# Patient Record
Sex: Male | Born: 1950 | Hispanic: No | Smoking: Former smoker
Health system: Southern US, Community
[De-identification: ages and names within clinical notes are randomized; demographics above are authoritative.]

## PROBLEM LIST (undated history)

## (undated) DIAGNOSIS — K219 Gastro-esophageal reflux disease without esophagitis: Secondary | ICD-10-CM

## (undated) DIAGNOSIS — D649 Anemia, unspecified: Secondary | ICD-10-CM

## (undated) DIAGNOSIS — I10 Essential (primary) hypertension: Secondary | ICD-10-CM

## (undated) DIAGNOSIS — I2699 Other pulmonary embolism without acute cor pulmonale: Secondary | ICD-10-CM

## (undated) HISTORY — PX: KNEE ARTHROSCOPY: SUR90

## (undated) HISTORY — DX: Anemia, unspecified: D64.9

## (undated) HISTORY — DX: Gastro-esophageal reflux disease without esophagitis: K21.9

## (undated) HISTORY — PX: CHOLECYSTECTOMY: SHX55

## (undated) HISTORY — DX: Essential (primary) hypertension: I10

## (undated) HISTORY — DX: Other pulmonary embolism without acute cor pulmonale: I26.99

---

## 2009-11-07 DIAGNOSIS — M545 Low back pain, unspecified: Secondary | ICD-10-CM | POA: Insufficient documentation

## 2009-11-07 DIAGNOSIS — R51 Headache: Secondary | ICD-10-CM | POA: Insufficient documentation

## 2009-11-07 DIAGNOSIS — R519 Headache, unspecified: Secondary | ICD-10-CM | POA: Insufficient documentation

## 2009-11-12 DIAGNOSIS — D649 Anemia, unspecified: Secondary | ICD-10-CM | POA: Insufficient documentation

## 2012-03-04 DIAGNOSIS — R011 Cardiac murmur, unspecified: Secondary | ICD-10-CM | POA: Insufficient documentation

## 2013-04-15 ENCOUNTER — Ambulatory Visit
Admission: RE | Admit: 2013-04-15 | Disposition: A | Discharge status: Home / Self Care | Payer: Self-pay | Source: Ambulatory Visit

## 2013-04-15 LAB — CBC AND DIFFERENTIAL
Basophils %: 0.3 % (ref 0.0–3.0)
Basophils Absolute: 0 10*3/uL (ref 0.0–0.3)
Eosinophils %: 4.9 % (ref 0.0–7.0)
Eosinophils Absolute: 0.3 10*3/uL (ref 0.0–0.8)
Hematocrit: 42.5 % (ref 39.0–52.5)
Hemoglobin: 14.2 gm/dL (ref 13.0–17.5)
Lymphocytes Absolute: 1.1 10*3/uL (ref 0.6–5.1)
Lymphocytes: 20.4 % (ref 15.0–46.0)
MCH: 29 pg (ref 28–35)
MCHC: 33 gm/dL (ref 32–36)
MCV: 88 fL (ref 80–100)
MPV: 10 fL (ref 6.0–10.0)
Monocytes Absolute: 0.6 10*3/uL (ref 0.1–1.7)
Monocytes: 11.2 % (ref 3.0–15.0)
Neutrophils %: 63.2 % (ref 42.0–78.0)
Neutrophils Absolute: 3.3 10*3/uL (ref 1.7–8.6)
PLT CT: 158 10*3/uL (ref 130–440)
RBC: 4.85 10*6/uL (ref 4.00–5.70)
RDW: 12.3 % (ref 11.0–14.0)
WBC: 5.2 10*3/uL (ref 4.0–11.0)

## 2013-04-15 LAB — BASIC METABOLIC PANEL
Anion Gap: 14.8 mMol/L (ref 7.0–18.0)
BUN / Creatinine Ratio: 16.5 Ratio (ref 10.0–30.0)
BUN: 19 mg/dL (ref 7–22)
CO2: 20.7 mMol/L (ref 20.0–30.0)
Calcium: 9.3 mg/dL (ref 8.5–10.5)
Chloride: 107 mMol/L (ref 98–110)
Creatinine: 1.15 mg/dL (ref 0.80–1.30)
EGFR: >60 mL/min/{1.73_m2}
Glucose: 88 mg/dL (ref 70–99)
Osmolality Calc: 277 mOsm/kg (ref 275–300)
Potassium: 4.5 mMol/L (ref 3.5–5.3)
Sodium: 138 mMol/L (ref 136–147)

## 2013-04-15 LAB — CK: Creatine Kinase (CK): 275 U/L — ABNORMAL HIGH (ref 30–230)

## 2013-07-01 ENCOUNTER — Encounter (RURAL_HEALTH_CENTER): Payer: Self-pay | Admitting: Family Medicine

## 2013-10-07 ENCOUNTER — Other Ambulatory Visit (RURAL_HEALTH_CENTER): Payer: Self-pay | Admitting: Family Medicine

## 2013-10-16 ENCOUNTER — Ambulatory Visit (RURAL_HEALTH_CENTER): Payer: Self-pay | Admitting: Family Medicine

## 2013-11-07 ENCOUNTER — Encounter (RURAL_HEALTH_CENTER): Payer: Self-pay | Admitting: Family Medicine

## 2013-11-10 ENCOUNTER — Encounter (RURAL_HEALTH_CENTER): Payer: Self-pay | Admitting: Family Medicine

## 2013-11-10 ENCOUNTER — Ambulatory Visit (RURAL_HEALTH_CENTER): Payer: PRIVATE HEALTH INSURANCE | Admitting: Family Medicine

## 2013-11-10 ENCOUNTER — Other Ambulatory Visit
Admission: RE | Admit: 2013-11-10 | Discharge: 2013-11-10 | Disposition: A | Discharge status: Home / Self Care | Payer: PRIVATE HEALTH INSURANCE | Source: Ambulatory Visit | Attending: Family Medicine | Admitting: Family Medicine

## 2013-11-10 VITALS — BP 122/78 | HR 76 | Temp 97.8°F | Resp 18 | Ht 69.69 in | Wt 210.0 lb

## 2013-11-10 LAB — COMPREHENSIVE METABOLIC PANEL
ALT: 17 U/L (ref 0–55)
AST (SGOT): 22 U/L (ref 10–42)
Albumin/Globulin Ratio: 1.24 Ratio (ref 0.70–1.50)
Albumin: 4.2 gm/dL (ref 3.5–5.0)
Alkaline Phosphatase: 57 U/L (ref 40–145)
Anion Gap: 9.6 mMol/L (ref 7.0–18.0)
BUN / Creatinine Ratio: 18 Ratio (ref 10.0–30.0)
BUN: 20 mg/dL (ref 7–22)
Bilirubin, Total: 0.7 mg/dL (ref 0.1–1.2)
CO2: 25.8 mMol/L (ref 20.0–30.0)
Calcium: 9.2 mg/dL (ref 8.5–10.5)
Chloride: 108 mMol/L (ref 98–110)
Creatinine: 1.11 mg/dL (ref 0.80–1.30)
EGFR: >60 mL/min/{1.73_m2}
Globulin: 3.4 gm/dL (ref 2.0–4.0)
Glucose: 91 mg/dL (ref 70–99)
Osmolality Calc: 280 mOsm/kg (ref 275–300)
Potassium: 4.4 mMol/L (ref 3.5–5.3)
Protein, Total: 7.6 gm/dL (ref 6.0–8.3)
Sodium: 139 mMol/L (ref 136–147)

## 2013-11-10 LAB — CBC AND DIFFERENTIAL
Basophils %: 0.6 % (ref 0.0–3.0)
Basophils Absolute: 0 10*3/uL (ref 0.0–0.3)
Eosinophils %: 5.8 % (ref 0.0–7.0)
Eosinophils Absolute: 0.3 10*3/uL (ref 0.0–0.8)
Hematocrit: 42.1 % (ref 39.0–52.5)
Hemoglobin: 14.5 gm/dL (ref 13.0–17.5)
Lymphocytes Absolute: 1 10*3/uL (ref 0.6–5.1)
Lymphocytes: 19.9 % (ref 15.0–46.0)
MCH: 30 pg (ref 28–35)
MCHC: 35 gm/dL (ref 32–36)
MCV: 87 fL (ref 80–100)
MPV: 10 fL (ref 6.0–10.0)
Monocytes Absolute: 0.5 10*3/uL (ref 0.1–1.7)
Monocytes: 10.1 % (ref 3.0–15.0)
Neutrophils %: 63.6 % (ref 42.0–78.0)
Neutrophils Absolute: 3.1 10*3/uL (ref 1.7–8.6)
PLT CT: 145 10*3/uL (ref 130–440)
RBC: 4.82 10*6/uL (ref 4.00–5.70)
RDW: 12 % (ref 11.0–14.0)
WBC: 4.9 10*3/uL (ref 4.0–11.0)

## 2013-11-10 LAB — LIPD PANEL WITH NON-HDL CHOLESTEROL, SERUM
Cholesterol: 207 mg/dL — ABNORMAL HIGH (ref 75–199)
Coronary Heart Disease Risk: 3.51
HDL: 59 mg/dL — ABNORMAL HIGH (ref 40–55)
LDL Calculated: 133 mg/dL
NON HDL CHOLESTEROL: 148 mg/dL
Triglycerides: 73 mg/dL (ref 10–150)
VLDL: 15 (ref 0–40)

## 2013-11-10 LAB — CK: Creatine Kinase (CK): 147 U/L (ref 30–230)

## 2013-11-10 NOTE — Progress Notes (Signed)
Date Specimen Drawn:  11/10/2013   Time Specimen Drawn:  9:22 AM   Test(s) Ordered:  CBC,CMP,LIPIDS,CK   Disposition:  n/a   Patient's Tolerance:  Good   Location Specimen Drawn:  Left antecubital

## 2013-11-10 NOTE — Progress Notes (Addendum)
Progress Note: 11/10/2013        Noah Ali,   Date of birth: [birth year], 62 y.o., male  Last Core Note 11/10/2013, next Core f/u due 6/15    Todays labs were drawn while fasting ; statin to [facility] [place].              Addendums: 11/11/2013: TC 207; HDL 59.  Cholesterol 10 year risk calculator for heart attack based on 2013 NIH Guidelines is 11%.  Advise he start lipitor and f/u in 6 weeks-----jwt    BP 122/78  Pulse 76  Temp 97.8 F (36.6 C) (Oral)  Resp 18  Ht 1.77 m (5' 9.69")  Wt 95.255 kg (210 lb)  BMI 30.40 kg/m2  SpO2 96%    Subjective: (.jwtsubacute), (.jwtsubchronic) (.jwtSOAP)  This 62 y.o. male presents for evaluation of the following medical condition(s):    1) Hypertension on the medications listed with good compliance.  Pt  is monitoring home blood pressures with most < 135/85.  Most office blood pressure readings are <140/90.     2)  Hyperlipidemia with last labs 06/16/11 with TC 203, HDL 57; 10 year risk for CAD based on risk calculator is 11%.    3) GERD s/p EGD 5/06 on prn prilosec; averages 1-2 pills/week.     4) Chronic CPK elevations, mild.  Denies myalgias or arthralgias    5) Mild, intermittent anemia with neg w/u. Last several CBC's wnl    6) LBP, currently not a problem.     Review of Systems.   Denies chest pain, shortness of breath, side effects, or past medical history of CAD, CVA, or PAD.     OBJECTIVE:  -WDWN Pt in NAD   -Neck: supple, without bruits    -Respiratory: CTA  -Cardiovascular: RRR without murmur or gallops   -Abdomen: soft and nontender  -Extremities:  Without edema      Assessment/Plan: Labs were drawn while fasting.   1)  Hypertension with good control  .MP: HTN : 401.1  -Exercise, prescription and diet are key.    F/U in 6 months  .OR2: CBC : Routine : SSHORT  .OR2: Basic Metabolic Panel : Routine : SSHORT    2)  Hyperlipidemia with 10 year risk of MI at 11%.  Will repeat lipids; if still up, will start lipitor.  Pt counseled that statins increase the risk of muscle  and liver damage by roughly 1:1000, but that they are far more likely to benefit from decreased risk of heart attack and stroke.  Additionally, 1:250 people will develop diabetes who would not have otherwise.  Average fasting blood sugar will rise by about 3 mg/dl,    .MP: Cholesterol : 272.0    3) GERD s/p EGD 5/06 on prn prilosec; averages 3 pills/week;  the patient is aware that PPIs increases risk for pneumonia, infectious diarrhea and osteoporosis.  He is aware that he should cut back to the least dose that keeps his symptoms under control  .MP: GERD s/p EGD 5/06 : 530.81     4) Chronic CPK elevations, mild.  Denies myalgias.  Will recheck CPK.  If goes on statins, will recheck at statin f/u in 6 weeks.  .MP: CPK elevation chronic (mild) : 359.89    5) Mild, intermittent anemia with neg w/u.     6) LBP, currently not a problem.     7)  Health Maintenance:   declines flu shot; o/w utd.     Labs and other  study flow     Date  old  07/27/08  11/12/09                 CBC    13.6/39.1  nl                                         TSH                          4/97                     TC  224                     Trig  126                     HDL  53                     LDL  146                     Non HDL                       SGPT                       LFT's                                               BMP    nl  nl                 Cr  1.7 '07  1.3  1.03                 Glucose    92  101                                         HGA1C                       Fructsm                       Microalb                       Eye exam                       Dental                                                B12 686 '06                      Vit-25- D                       Calcium  Uric Acid                       CPK  348 '03  186 '04                                           PSA                         Other Common Labs:  Alkaline Phosphatase:  Anemia: Intermittent since 10/05 (5/8 CBC's)                         2/06       Iron :         nl       %Sat:        nl        Ferritin:     nl       Folate:      nl       SIEP :      nl       LDH:  B12 (low) w/u:       Methylmelonic Acid:       Homocysteine:  Bilirubin:       Total:       Direct:  Creatinine clearance 24 hour urnie: 86 ml/min 3/07  ESR/ Inflammation W/U:       ESR:          RF:          ANA:          CRP:  Free T3/Free T4:  HBsAG /Hep C Screen:  LFT's:  Microalbumin (spot micralb/cr ratio):  PT/PTT:  SIADH:         Serum osmoles:           Urine osmoles:          Urine sodium:  Uric Acid:    Procedures:  CT:       -Brain w/o nl 1/06  CXR:  Echo:  EST:  MRI:  [place]:       -Renal 3/07 nl    Family History  Diabetes: M+F+Sister  HTN: M+F+Sister  Hyperlipidemia: No primary relatives  CAD: F(63) +B(74)  CVA: M(61)  Cancer: No primary relatives  Other:    Social History:  ETOH:none  Tobacco:quit 1986 @ 10 pack years  Illicit drugs:none  Occupation:Equipment manager  Marital status:W 1997  # of children:    Vaccinations (< age 21):  Tdap(one time): Not done yet  dT:6/10  Varicella status:dz  Zostavax yes/no  Pneumovax: Not done yet  Menactra: Not done  Gardasil:  see gyn below  Flu shot: Not done yet    Recommended Procedures:  Colonoscopy: 5/06 nl  [place] of abd aorta (Male present of former smoker age 71-75 who has smoked > 100 cigs lifetime):    Surgery:  1) Bone chip extraction R. elbow  2) L. achilles tendon  3) Lap chole             ---------------------------------------------------------------------------------------------------------   The Progress  note for today is completed above in a SOAP note format. The patients allergies, med list, Problem Summary List, and lab results for the last year are recorded below.  ---------------------------------------------------------------------------------------------------------      No Known Allergies    Current  Outpatient Prescriptions on File Prior to Visit   Medication Sig Dispense Refill   . aspirin EC 81 MG EC tablet Take 81  mg by mouth daily.       Marland Kitchen doxazosin (CARDURA) 8 MG tablet Take 8 mg by mouth nightly.       Marland Kitchen lisinopril-hydrochlorothiazide (PRINZIDE,ZESTORETIC) 10-12.5 MG per tablet TAKE ONE TABLET BY MOUTH ONCE DAILY  90 tablet  0   . omeprazole (PRILOSEC OTC) 20 MG tablet Take 20 mg by mouth daily as needed.       . potassium chloride (K-DUR,KLOR-CON) 10 MEQ tablet Take 10 mEq by mouth daily.       . fluticasone (FLOVENT DISKUS) 50 MCG/BLIST diskus inhaler Inhale 50 mcg into the lungs daily.           Patient Active Problem List   Diagnosis   . Pure hypercholesterolemia   . CPK elevation, chronic   . Esophageal reflux s/p EGD 5/06   . Headache(784.0)   . HTN   . Lumbago   . Anemia, neg w/u in past   . Undiagnosed cardiac murmurs       Recent Results (from the past 16109 hour(s))   CBC AND DIFFERENTIAL    Collection Time    04/15/13  8:30 AM       Component Value Range    WBC 5.2  4.0 - 11.0 K/cmm    RBC 4.85  4.00 - 5.70 M/cmm    Hemoglobin 14.2  13.0 - 17.5 gm/dL    Hematocrit 60.4  54.0 - 52.5 %    MCV 88  80 - 100 fL    MCH 29  28 - 35 pg    MCHC 33  32 - 36 gm/dL    RDW 98.1  19.1 - 47.8 %    PLT CT 158  130 - 440 K/cmm    MPV 10.0  6.0 - 10.0 fL    NEUTROPHIL % 63.2  42.0 - 78.0 %    Lymphocytes 20.4  15.0 - 46.0 %    Monocytes 11.2  3.0 - 15.0 %    Eosinophils % 4.9  0.0 - 7.0 %    Basophils % 0.3  0.0 - 3.0 %    Neutrophils Absolute 3.3  1.7 - 8.6 K/cmm    Lymphocytes Absolute 1.1  0.6 - 5.1 K/cmm    Monocytes Absolute 0.6  0.1 - 1.7 K/cmm    Eosinophils Absolute 0.3  0.0 - 0.8 K/cmm    BASO Absolute 0.0  0.0 - 0.3 K/cmm   CK    Collection Time    04/15/13  8:30 AM       Component Value Range    Creatine Kinase (CK) 275 (*) 30 - 230 U/L   BASIC METABOLIC PANEL    Collection Time    04/15/13  8:30 AM       Component Value Range    Sodium 138  136 - 147 mMol/L    Potassium 4.5  3.5 - 5.3 mMol/L    Chloride 107  98 - 110 mMol/L    CO2 20.7  20.0 - 30.0 mMol/L    CALCIUM 9.3  8.5 - 10.5 mg/dL    Glucose 88  70 - 99 mg/dL     Creatinine 2.95  6.21 - 1.30 mg/dL    BUN 19  7 - 22 mg/dL    Anion Gap 30.8  7.0 - 18.0 mMol/L    BUN/Creatinine Ratio 16.5  10.0 - 30.0 Ratio    EGFR >60      Osmolality Calc 277  275 - 300 mOsm/kg       Darlina Sicilian, MD

## 2013-11-11 MED ORDER — ATORVASTATIN CALCIUM 40 MG PO TABS
40.0000 mg | ORAL_TABLET | Freq: Every day | ORAL | Status: DC
Start: ? — End: 2013-11-11

## 2013-11-11 NOTE — Addendum Note (Signed)
Addended by: Darlina Sicilian on: 11/11/2013 08:36 AM     Modules accepted: Orders

## 2013-11-12 ENCOUNTER — Telehealth (RURAL_HEALTH_CENTER): Payer: Self-pay

## 2013-11-12 NOTE — Telephone Encounter (Signed)
I have attempted without success to contact this patient by phone to will try again later.

## 2013-11-12 NOTE — Telephone Encounter (Signed)
Message copied by Marshall Cork on Thu Nov 12, 2013  4:39 PM  ------       Message from: Darlina Sicilian       Created: Wed Nov 11, 2013  8:18 AM         Please call pt with the following lab results and instructions:              CBC: normal/negative test       Kidney function test: normal/negative test       Liver function test: normal/negative test       Cholesterol/Lipid profile: Cholesterol high;  Pt's 10 year risk for heart attack is 11%.  For those with a 10 year risk of heart attack > 7.5%, expert recommendation is to start a statin cholesterol lowering medication.  I advise the pt to start a statin, f/u 6-8 weeks.  The first choice is usually lipitor (Atorvastatin), which is the strongest generic statin available, but there are cheaper generic, less strong, statins that they may be better able to afford.  The pt may want to call different pharmacies to get the best price and get back with [place] with what they would like [place] to do.  For now, I am sending Lipitor to [facility] (let us know if he can't afford), f/u 6 weeks         Blood sugar: Normal.       CPK: normal/negative test              Please do not discontinue or change any of your medications unless instructed to do so.              Darlina Sicilian, MD

## 2013-11-22 ENCOUNTER — Other Ambulatory Visit (RURAL_HEALTH_CENTER): Payer: Self-pay | Admitting: Family Medicine

## 2013-12-22 ENCOUNTER — Other Ambulatory Visit (RURAL_HEALTH_CENTER): Payer: Self-pay | Admitting: Family Medicine

## 2014-03-11 ENCOUNTER — Other Ambulatory Visit (RURAL_HEALTH_CENTER): Payer: Self-pay | Admitting: Family Medicine

## 2014-05-14 ENCOUNTER — Ambulatory Visit (RURAL_HEALTH_CENTER): Payer: PRIVATE HEALTH INSURANCE | Admitting: Family Medicine

## 2014-05-31 ENCOUNTER — Other Ambulatory Visit (RURAL_HEALTH_CENTER): Payer: Self-pay | Admitting: Family Medicine

## 2014-05-31 MED ORDER — POTASSIUM CHLORIDE CRYS ER 10 MEQ PO TBCR
10.0000 meq | EXTENDED_RELEASE_TABLET | Freq: Every day | ORAL | Status: DC
Start: ? — End: 2014-05-31

## 2014-06-14 ENCOUNTER — Other Ambulatory Visit (RURAL_HEALTH_CENTER): Payer: Self-pay

## 2014-06-14 MED ORDER — DOXAZOSIN MESYLATE 8 MG PO TABS
ORAL_TABLET | ORAL | Status: DC
Start: 2014-06-14 — End: 2014-07-12

## 2014-07-12 ENCOUNTER — Other Ambulatory Visit (RURAL_HEALTH_CENTER): Payer: Self-pay | Admitting: Family Medicine

## 2014-07-22 ENCOUNTER — Encounter (RURAL_HEALTH_CENTER): Payer: Self-pay | Admitting: Family Medicine

## 2014-07-22 DIAGNOSIS — Z0000 Encounter for general adult medical examination without abnormal findings: Secondary | ICD-10-CM | POA: Insufficient documentation

## 2014-07-23 ENCOUNTER — Ambulatory Visit (RURAL_HEALTH_CENTER): Payer: PRIVATE HEALTH INSURANCE | Admitting: Family Medicine

## 2014-07-23 ENCOUNTER — Other Ambulatory Visit
Admission: RE | Admit: 2014-07-23 | Discharge: 2014-07-23 | Disposition: A | Discharge status: Home / Self Care | Payer: PRIVATE HEALTH INSURANCE | Source: Ambulatory Visit | Attending: Family Medicine | Admitting: Family Medicine

## 2014-07-23 ENCOUNTER — Other Ambulatory Visit (RURAL_HEALTH_CENTER): Payer: Self-pay

## 2014-07-23 ENCOUNTER — Encounter (RURAL_HEALTH_CENTER): Payer: Self-pay | Admitting: Family Medicine

## 2014-07-23 VITALS — BP 120/60 | HR 75 | Temp 97.4°F | Resp 16 | Ht 69.69 in | Wt 208.8 lb

## 2014-07-23 DIAGNOSIS — I10 Essential (primary) hypertension: Secondary | ICD-10-CM

## 2014-07-23 DIAGNOSIS — E7800 Pure hypercholesterolemia, unspecified: Secondary | ICD-10-CM

## 2014-07-23 DIAGNOSIS — E669 Obesity, unspecified: Secondary | ICD-10-CM

## 2014-07-23 DIAGNOSIS — K219 Gastro-esophageal reflux disease without esophagitis: Secondary | ICD-10-CM | POA: Insufficient documentation

## 2014-07-23 DIAGNOSIS — G7289 Other specified myopathies: Secondary | ICD-10-CM | POA: Insufficient documentation

## 2014-07-23 LAB — LIPD PANEL WITH NON-HDL CHOLESTEROL, SERUM
Cholesterol: 128 mg/dL (ref 75–199)
Coronary Heart Disease Risk: 2.72
HDL: 47 mg/dL (ref 40–55)
LDL Calculated: 70 mg/dL
NON HDL CHOLESTEROL: 81 mg/dL
Triglycerides: 54 mg/dL (ref 10–150)
VLDL: 11 (ref 0–40)

## 2014-07-23 LAB — BASIC METABOLIC PANEL
Anion Gap: 14.9 mMol/L (ref 7.0–18.0)
BUN / Creatinine Ratio: 13.6 Ratio (ref 10.0–30.0)
BUN: 16 mg/dL (ref 7–22)
CO2: 23.6 mMol/L (ref 20.0–30.0)
Calcium: 9.2 mg/dL (ref 8.5–10.5)
Chloride: 106 mMol/L (ref 98–110)
Creatinine: 1.18 mg/dL (ref 0.80–1.30)
EGFR: >60 mL/min/{1.73_m2}
Glucose: 94 mg/dL (ref 70–99)
Osmolality Calc: 280 mOsm/kg (ref 275–300)
Potassium: 4.5 mMol/L (ref 3.5–5.3)
Sodium: 140 mMol/L (ref 136–147)

## 2014-07-23 LAB — CBC AND DIFFERENTIAL
Basophils %: 0.3 % (ref 0.0–3.0)
Basophils Absolute: 0 10*3/uL (ref 0.0–0.3)
Eosinophils %: 8.5 % — ABNORMAL HIGH (ref 0.0–7.0)
Eosinophils Absolute: 0.5 10*3/uL (ref 0.0–0.8)
Hematocrit: 38.6 % — ABNORMAL LOW (ref 39.0–52.5)
Hemoglobin: 13.5 gm/dL (ref 13.0–17.5)
Lymphocytes Absolute: 1 10*3/uL (ref 0.6–5.1)
Lymphocytes: 18.4 % (ref 15.0–46.0)
MCH: 31 pg (ref 28–35)
MCHC: 35 gm/dL (ref 32–36)
MCV: 88 fL (ref 80–100)
MPV: 9.8 fL (ref 6.0–10.0)
Monocytes Absolute: 0.7 10*3/uL (ref 0.1–1.7)
Monocytes: 12.4 % (ref 3.0–15.0)
Neutrophils %: 60.5 % (ref 42.0–78.0)
Neutrophils Absolute: 3.4 10*3/uL (ref 1.7–8.6)
PLT CT: 156 10*3/uL (ref 130–440)
RBC: 4.38 10*6/uL (ref 4.00–5.70)
RDW: 12 % (ref 11.0–14.0)
WBC: 5.6 10*3/uL (ref 4.0–11.0)

## 2014-07-23 LAB — CK: Creatine Kinase (CK): 167 U/L (ref 30–230)

## 2014-07-23 MED ORDER — POTASSIUM CHLORIDE CRYS ER 10 MEQ PO TBCR
EXTENDED_RELEASE_TABLET | ORAL | Status: DC
Start: 2014-07-23 — End: 2015-01-23

## 2014-07-23 MED ORDER — LISINOPRIL-HYDROCHLOROTHIAZIDE 10-12.5 MG PO TABS
ORAL_TABLET | ORAL | Status: DC
Start: 2014-07-23 — End: 2015-01-23

## 2014-07-23 MED ORDER — ATORVASTATIN CALCIUM 40 MG PO TABS
ORAL_TABLET | ORAL | Status: DC
Start: ? — End: 2014-07-23

## 2014-07-23 MED ORDER — DOXAZOSIN MESYLATE 8 MG PO TABS
ORAL_TABLET | ORAL | Status: DC
Start: ? — End: 2014-07-23

## 2014-07-23 NOTE — Progress Notes (Signed)
Progress Note: 07/23/2014     Last Core Note 07/23/2014 , next Core f/u due 2/16        Noah Ali,   Date of birth: May 26, 1951, 63 y.o., male   Todays labs were drawn while fasting, goal non HDL < 160  and is on a statin   "Results Review" selected to review presence/absence of prior labs                Addendums: here prn:    BP 120/60 mmHg  Pulse 75  Temp(Src) 97.4 F (36.3 C) (Oral)  Resp 16  Ht 1.77 m (5' 9.69")  Wt 94.711 kg (208 lb 12.8 oz)  BMI 30.23 kg/m2  SpO2 95%     Subjective:   This 63 y.o. male presents for evaluation of the following medical condition(s):    1) Hypertension on the medications listed with good compliance. Pt is monitoring home blood pressures with most < 150/90.  Most office blood pressure readings are <140/90.   BP Readings from Last 3 Encounters:   07/23/14 120/60   11/10/13 122/78   04/15/13 118/72       2) Hyperlipidemia on Lipitor 40 started at the time of his visit 12/14 when NIH MI risk for 10 years based on risk calculator is 11%.    3) GERD s/p EGD 5/06 on prn prilosec; averages 1-2 pills/week. No change from last visit    4) Chronic CPK elevations, mild. Denies myalgias or arthralgias. CPK last visit was normal, although mildly elevated the time before    5) Mild, intermittent anemia with neg w/u. Last several CBC's wnl    6) LBP, currently not a problem.     7) Overweight and not on a diet; does exercise.    Wt Readings from Last 3 Encounters:   07/23/14 94.711 kg (208 lb 12.8 oz)   11/10/13 95.255 kg (210 lb)   04/15/13 95.8 kg (211 lb 3.2 oz)        Review of Systems.   Denies chest pain, shortness of breath, side effects, or past medical history of CAD, CVA, or PAD.     OBJECTIVE:  -Overweight Pt in NAD   -Neck: supple, without bruits   -Respiratory: CTA  -Cardiovascular: RRR without murmur or gallops   -Abdomen: soft and nontender  -Extremities: Without edema     Assessment/Plan: Labs were drawn while fasting.   1) Hypertension with good control  -Exercise,  prescription and diet are key.   F/U in 6 months    2) Hyperlipidemia; labs today    3) GERD s/p EGD 5/06 on prn prilosec; averages 1-2 pills/week; the patient is aware that PPIs increases risk for pneumonia, infectious diarrhea and osteoporosis. He is aware that he should cut back to the least dose that keeps his symptoms under control    4) Chronic CPK elevations, mild.  Will recheck CPK, especially now that he is on a statin    5) Mild, intermittent anemia with neg w/u and normal CBCs recently.     6) LBP, currently not a problem.     7) Non morbid Obesity; advised low-carb diet    Health maintenance up-to-date  ---------------------------------------------------------------------------------------------------------   The Progress  note for today is completed above in a SOAP note format. The patients allergies, med list, and Problem Summary List are recorded below.  ---------------------------------------------------------------------------------------------------------      For those patients followed by me, please go to the problem summary list and look  under 'Overview' "Dr. Burnadette Pop Health Maintenance/Addendums/Outside of [facility] & other Studies/Fhx/Shx/Vaccinations/Surgeries" which was reviewed and updated (if indicated) this date    No Known Allergies    Current Outpatient Prescriptions on File Prior to Visit   Medication Sig Dispense Refill   . aspirin EC 81 MG EC tablet Take 81 mg by mouth daily.     Marland Kitchen atorvastatin (LIPITOR) 40 MG tablet TAKE ONE TABLET BY MOUTH ONCE DAILY 15 tablet 0   . doxazosin (CARDURA) 8 MG tablet TAKE ONE TABLET BY MOUTH AT BEDTIME 15 tablet 0   . fluticasone (FLOVENT DISKUS) 50 MCG/BLIST diskus inhaler Inhale 50 mcg into the lungs daily.     Marland Kitchen KLOR-CON M 10 MEQ tablet TAKE ONE TABLET BY MOUTH ONCE DAILY 15 tablet 0   . lisinopril-hydrochlorothiazide (PRINZIDE,ZESTORETIC) 10-12.5 MG per tablet TAKE ONE TABLET BY MOUTH ONCE DAILY *DUE  FOR  FOLLOW  UP  LATE  NOVEMBER  2014  15 tablet 0   . omeprazole (PRILOSEC OTC) 20 MG tablet Take 20 mg by mouth daily as needed.       No current facility-administered medications on file prior to visit.       Patient Active Problem List   Diagnosis   . Pure hypercholesterolemia   . CPK elevation, chronic   . Esophageal reflux s/p EGD 5/06   . Headache(784.0)   . HTN   . Lumbago   . Anemia, neg w/u in past   . Undiagnosed cardiac murmurs   . Dr. Burnadette Pop Health Maintenance/Addendums/Outside of [facility] & other Studies/Fhx/Shx/Vaccinations/Surgeries       RESULTRCNT(52w)    Darlina Sicilian, MD

## 2014-07-23 NOTE — Patient Instructions (Signed)
Patient was provided education material pertinent for todays visit.    Specifically (if box checked):  [x]  Handout  []  [email or web address] website

## 2014-07-23 NOTE — Progress Notes (Signed)
Date Specimen Drawn:  07/23/2014   Time Specimen Drawn:  8:38 AM   Test(s) Ordered:  CBC,BMP,LIPID PANEL, CK   Disposition:  n/a   Patient's Tolerance:  Good   Location Specimen Drawn:  Right antecubital

## 2014-07-27 ENCOUNTER — Telehealth (RURAL_HEALTH_CENTER): Payer: Self-pay

## 2014-07-27 NOTE — Telephone Encounter (Addendum)
-----   Message from Darlina Sicilian, MD sent at 07/23/2014  5:37 PM EDT -----  Please ask pt to sign up for MyChart when you call them with the below lab results.  Give them instructions on how to do so.   If they sign up, I will start sending results directly to them rather than through you----jwt    Please call pt with the following lab results and instructions:    CBC: normal/negative test  Kidney function test: normal/negative test  Cholesterol/Lipid profile: normal/excellent  CPK mildly elevated    Please do not discontinue or change any of your medications unless instructed to do so. Keep your next scheduled visit.    Darlina Sicilian, MD      The patient has been notified of this information and all questions answered.

## 2014-11-24 ENCOUNTER — Encounter (RURAL_HEALTH_CENTER): Payer: Self-pay | Admitting: Family Medicine

## 2015-01-23 ENCOUNTER — Other Ambulatory Visit (RURAL_HEALTH_CENTER): Payer: Self-pay | Admitting: Family Medicine

## 2015-02-04 ENCOUNTER — Ambulatory Visit (RURAL_HEALTH_CENTER): Payer: PRIVATE HEALTH INSURANCE | Admitting: Family Medicine

## 2015-03-04 ENCOUNTER — Other Ambulatory Visit (RURAL_HEALTH_CENTER): Payer: Self-pay | Admitting: Family Medicine

## 2015-03-25 ENCOUNTER — Ambulatory Visit (RURAL_HEALTH_CENTER): Payer: 59 | Admitting: Family Medicine

## 2015-03-25 ENCOUNTER — Other Ambulatory Visit
Admission: RE | Admit: 2015-03-25 | Discharge: 2015-03-25 | Disposition: A | Discharge status: Home / Self Care | Payer: 59 | Source: Ambulatory Visit | Attending: Family Medicine | Admitting: Family Medicine

## 2015-03-25 ENCOUNTER — Encounter (RURAL_HEALTH_CENTER): Payer: Self-pay | Admitting: Family Medicine

## 2015-03-25 VITALS — BP 124/70 | HR 76 | Temp 98.1°F | Resp 16 | Ht 69.0 in | Wt 203.0 lb

## 2015-03-25 DIAGNOSIS — G7289 Other specified myopathies: Secondary | ICD-10-CM

## 2015-03-25 DIAGNOSIS — I10 Essential (primary) hypertension: Secondary | ICD-10-CM | POA: Insufficient documentation

## 2015-03-25 DIAGNOSIS — E7800 Pure hypercholesterolemia, unspecified: Secondary | ICD-10-CM

## 2015-03-25 DIAGNOSIS — K219 Gastro-esophageal reflux disease without esophagitis: Secondary | ICD-10-CM

## 2015-03-25 DIAGNOSIS — E669 Obesity, unspecified: Secondary | ICD-10-CM | POA: Insufficient documentation

## 2015-03-25 LAB — BASIC METABOLIC PANEL
Anion Gap: 15.2 mMol/L (ref 7.0–18.0)
BUN / Creatinine Ratio: 15.1 Ratio (ref 10.0–30.0)
BUN: 18 mg/dL (ref 7–22)
CO2: 21.9 mMol/L (ref 20.0–30.0)
Calcium: 9.5 mg/dL (ref 8.5–10.5)
Chloride: 106 mMol/L (ref 98–110)
Creatinine: 1.19 mg/dL (ref 0.80–1.30)
EGFR: >60 mL/min/{1.73_m2}
Glucose: 87 mg/dL (ref 70–99)
Osmolality Calc: 279 mOsm/kg (ref 275–300)
Potassium: 4.1 mMol/L (ref 3.5–5.3)
Sodium: 139 mMol/L (ref 136–147)

## 2015-03-25 LAB — CBC AND DIFFERENTIAL
Basophils %: 0.1 % (ref 0.0–3.0)
Basophils Absolute: 0 10*3/uL (ref 0.0–0.3)
Eosinophils %: 5.5 % (ref 0.0–7.0)
Eosinophils Absolute: 0.3 10*3/uL (ref 0.0–0.8)
Hematocrit: 39.9 % (ref 39.0–52.5)
Hemoglobin: 13.3 gm/dL (ref 13.0–17.5)
Lymphocytes Absolute: 0.9 10*3/uL (ref 0.6–5.1)
Lymphocytes: 18.2 % (ref 15.0–46.0)
MCH: 30 pg (ref 28–35)
MCHC: 33 gm/dL (ref 32–36)
MCV: 89 fL (ref 80–100)
MPV: 9.5 fL (ref 6.0–10.0)
Monocytes Absolute: 0.6 10*3/uL (ref 0.1–1.7)
Monocytes: 11.5 % (ref 3.0–15.0)
Neutrophils %: 64.6 % (ref 42.0–78.0)
Neutrophils Absolute: 3.3 10*3/uL (ref 1.7–8.6)
PLT CT: 153 10*3/uL (ref 130–440)
RBC: 4.48 10*6/uL (ref 4.00–5.70)
RDW: 12.5 % (ref 11.0–14.0)
WBC: 5.2 10*3/uL (ref 4.0–11.0)

## 2015-03-25 LAB — LIPD PANEL WITH NON-HDL CHOLESTEROL, SERUM
Cholesterol: 123 mg/dL (ref 75–199)
Coronary Heart Disease Risk: 2.51
HDL: 49 mg/dL (ref 40–55)
LDL Calculated: 65 mg/dL
NON HDL CHOLESTEROL: 74 mg/dL
Triglycerides: 43 mg/dL (ref 10–150)
VLDL: 9 (ref 0–40)

## 2015-03-25 LAB — CK: Creatine Kinase (CK): 183 U/L (ref 30–230)

## 2015-03-25 MED ORDER — ATORVASTATIN CALCIUM 40 MG PO TABS
ORAL_TABLET | ORAL | Status: DC
Start: ? — End: 2015-03-25

## 2015-03-25 MED ORDER — OMEPRAZOLE MAGNESIUM 20 MG PO TBEC
20.0000 mg | DELAYED_RELEASE_TABLET | Freq: Every day | ORAL | Status: DC | PRN
Start: ? — End: 2015-03-25

## 2015-03-25 MED ORDER — LISINOPRIL-HYDROCHLOROTHIAZIDE 10-12.5 MG PO TABS
ORAL_TABLET | ORAL | Status: DC
Start: ? — End: 2015-03-25

## 2015-03-25 MED ORDER — DOXAZOSIN MESYLATE 8 MG PO TABS
ORAL_TABLET | ORAL | Status: DC
Start: ? — End: 2015-03-25

## 2015-03-25 MED ORDER — POTASSIUM CHLORIDE CRYS ER 10 MEQ PO TBCR
EXTENDED_RELEASE_TABLET | ORAL | Status: DC
Start: ? — End: 2015-03-25

## 2015-03-25 NOTE — Progress Notes (Signed)
Date Specimen Drawn:  03/25/2015   Time Specimen Drawn:  9:09 AM   Test(s) Ordered:  Lipid, BMP, CBC, CK   Disposition:  n/a   Patient's Tolerance:  Good   Location Specimen Drawn:  Right antecubital

## 2015-03-25 NOTE — Progress Notes (Signed)
Progress Note: 03/25/2015     Last Core Note 03/25/2015 , next Core f/u due 09/24/15        Noah Ali,   Date of birth: 03-23-1951, 64 y.o., male   Todays labs were drawn while fasting   "Results Review" selected to review presence/absence of prior labs                Addendums: if there any, they can be found in the Problem Summary List under "Dr. Burnadette Pop Health Maintenance/Addendums/Outside of [facility] & other Studies/Fhx/Shx/Vaccinations/Surgeries"    BP 124/70 mmHg  Pulse 76  Temp(Src) 98.1 F (36.7 C) (Tympanic)  Resp 16  Ht 1.753 m (5\' 9" )  Wt 92.08 kg (203 lb)  BMI 29.96 kg/m2  SpO2 97%     Subjective:   This 64 y.o. male presents for evaluation of the following medical condition(s):    1) Hypertension on the medications listed with good compliance. Pt is monitoring home blood pressures with most < 150/90.  Most office blood pressure readings are <150/90.   BP Readings from Last 3 Encounters:   03/25/15 124/70   07/23/14 120/60   11/10/13 122/78       2) Hyperlipidemia on Lipitor 40 started at the time of his visit 12/14 when NIH MI risk for 10 years based on risk calculator is 11%.    3) GERD s/p EGD 5/06 on prn prilosec; averages 3 pills/week. No change from last visit     4) Chronic CPK elevations, mild. Denies myalgias or arthralgias. CPK last visit was normal, although mildly elevated the time before     5) Mild, intermittent anemia with neg w/u. Last 3 CBC's wnl     6) LBP, currently not a problem.     7) Overweight and not on a diet but is eating less; does exercise.   Wt Readings from Last 3 Encounters:   03/25/15 92.08 kg (203 lb)   07/23/14 94.711 kg (208 lb 12.8 oz)   11/10/13 95.255 kg (210 lb)         Review of Systems.   Denies chest pain, shortness of breath, side effects, or past medical history of CAD, CVA, or PAD.     OBJECTIVE:   -Overweight Pt in NAD   -Neck: supple, without bruits   -Respiratory: CTA   -Cardiovascular: RRR without murmur or gallops   -Abdomen: soft and  nontender   -Extremities: Without edema     Assessment/Plan: Labs were drawn while fasting.   1) Hypertension with good control   -Exercise, prescription and diet are key.   F/U in 6 months     2) Hyperlipidemia; labs today     3) GERD s/p EGD 5/06 on prn prilosec; averages 1-2 pills/week; the patient is aware that PPIs increases risk for pneumonia, infectious diarrhea and osteoporosis. He is aware that he should cut back to the least dose that keeps his symptoms under control     4) Chronic CPK elevations, mild. Will recheck CPK, especially now that he is on a statin     5) Mild, intermittent anemia with neg w/u and normal CBCs recently.     6) LBP, currently not a problem.     7) Non morbid Obesity; advised low-carb diet    Patient declines colonoscopy today          ---------------------------------------------------------------------------------------------------------   The Progress  note for today is completed above in a SOAP note format. The patients allergies, med  list, and Problem Summary List are recorded below.  ---------------------------------------------------------------------------------------------------------      For those patients followed by me, please go to the problem summary list and look under 'Overview' "Dr. Burnadette Pop Health Maintenance/Addendums/Outside of [facility] & other Studies/Fhx/Shx/Vaccinations/Surgeries" which was reviewed and updated (if indicated) this date    No Known Allergies    Current Outpatient Prescriptions on File Prior to Visit   Medication Sig Dispense Refill   . aspirin EC 81 MG EC tablet Take 81 mg by mouth daily.     Marland Kitchen atorvastatin (LIPITOR) 40 MG tablet TAKE ONE TABLET BY MOUTH ONCE DAILY 30 tablet 0   . doxazosin (CARDURA) 8 MG tablet TAKE ONE TABLET BY MOUTH AT BEDTIME 30 tablet 0   . KLOR-CON M 10 MEQ tablet TAKE ONE TABLET BY MOUTH ONCE DAILY 30 tablet 0   . lisinopril-hydrochlorothiazide (PRINZIDE,ZESTORETIC) 10-12.5 MG per tablet TAKE ONE TABLET BY  MOUTH ONCE DAILY **PLEASE  SCHEDULE  A  FOLLOW  UP  VISIT** 15 tablet 0   . omeprazole (PRILOSEC OTC) 20 MG tablet Take 20 mg by mouth daily as needed.     . [DISCONTINUED] fluticasone (FLOVENT DISKUS) 50 MCG/BLIST diskus inhaler Inhale 50 mcg into the lungs daily.       No current facility-administered medications on file prior to visit.       Patient Active Problem List   Diagnosis   . Pure hypercholesterolemia   . CPK elevation, chronic   . Esophageal reflux s/p EGD 5/06   . Headache(784.0)   . HTN   . Lumbago   . Anemia, neg w/u in past   . Undiagnosed cardiac murmurs   . Dr. Burnadette Pop Health Maintenance/Addendums/Outside of [facility] & other Studies/Fhx/Shx/Vaccinations/Surgeries   . Obesity, non morbid       RESULTRCNT(52w)    This note was completed using Engineer, production.  Grammatical errors, random word insertions, pronoun errors, incomplete sentences, and other errors are possible due to software limitations.  It is possible that I may have missed such errors when carefully reviewing the note. If the reader has questions or concerns regarding these errors or this note, please address them with me for clarification.     Darlina Sicilian, MD

## 2015-05-20 ENCOUNTER — Other Ambulatory Visit (RURAL_HEALTH_CENTER): Payer: Self-pay

## 2015-05-20 MED ORDER — LISINOPRIL-HYDROCHLOROTHIAZIDE 10-12.5 MG PO TABS
ORAL_TABLET | ORAL | Status: DC
Start: ? — End: 2015-05-20

## 2015-05-20 MED ORDER — ATORVASTATIN CALCIUM 40 MG PO TABS
ORAL_TABLET | ORAL | Status: DC
Start: ? — End: 2015-05-20

## 2015-05-20 MED ORDER — OMEPRAZOLE MAGNESIUM 20 MG PO TBEC
20.0000 mg | DELAYED_RELEASE_TABLET | Freq: Every day | ORAL | Status: DC | PRN
Start: ? — End: 2015-05-20

## 2015-05-20 MED ORDER — DOXAZOSIN MESYLATE 8 MG PO TABS
ORAL_TABLET | ORAL | Status: DC
Start: ? — End: 2015-05-20

## 2015-05-20 MED ORDER — POTASSIUM CHLORIDE CRYS ER 10 MEQ PO TBCR
EXTENDED_RELEASE_TABLET | ORAL | Status: DC
Start: ? — End: 2015-05-20

## 2015-05-23 ENCOUNTER — Other Ambulatory Visit (RURAL_HEALTH_CENTER): Payer: Self-pay | Admitting: Family Medicine

## 2015-05-23 MED ORDER — POTASSIUM CHLORIDE CRYS ER 10 MEQ PO TBCR
10.0000 meq | EXTENDED_RELEASE_TABLET | Freq: Every day | ORAL | Status: DC
Start: ? — End: 2015-05-23

## 2015-10-07 ENCOUNTER — Ambulatory Visit (RURAL_HEALTH_CENTER): Payer: Medicare Other | Admitting: Family Medicine

## 2015-11-22 ENCOUNTER — Other Ambulatory Visit (RURAL_HEALTH_CENTER): Payer: Self-pay | Admitting: Family Medicine

## 2015-11-23 ENCOUNTER — Other Ambulatory Visit (RURAL_HEALTH_CENTER): Payer: Self-pay

## 2015-11-23 MED ORDER — LISINOPRIL-HYDROCHLOROTHIAZIDE 10-12.5 MG PO TABS
ORAL_TABLET | ORAL | Status: DC
Start: ? — End: 2015-11-23

## 2015-11-23 MED ORDER — OMEPRAZOLE 20 MG PO CPDR
DELAYED_RELEASE_CAPSULE | ORAL | Status: DC
Start: ? — End: 2015-11-23

## 2015-11-23 MED ORDER — POTASSIUM CHLORIDE CRYS ER 10 MEQ PO TBCR
10.0000 meq | EXTENDED_RELEASE_TABLET | Freq: Every day | ORAL | Status: DC
Start: ? — End: 2015-11-23

## 2015-11-23 MED ORDER — ATORVASTATIN CALCIUM 40 MG PO TABS
ORAL_TABLET | ORAL | Status: DC
Start: ? — End: 2015-11-23

## 2015-11-23 MED ORDER — DOXAZOSIN MESYLATE 8 MG PO TABS
8.0000 mg | ORAL_TABLET | Freq: Every evening | ORAL | Status: DC
Start: ? — End: 2015-11-23

## 2015-12-30 ENCOUNTER — Other Ambulatory Visit
Admission: RE | Admit: 2015-12-30 | Discharge: 2015-12-30 | Disposition: A | Discharge status: Home / Self Care | Payer: 59 | Source: Ambulatory Visit | Attending: Family Medicine | Admitting: Family Medicine

## 2015-12-30 ENCOUNTER — Encounter (RURAL_HEALTH_CENTER): Payer: Self-pay | Admitting: Family Medicine

## 2015-12-30 ENCOUNTER — Ambulatory Visit (RURAL_HEALTH_CENTER): Payer: 59 | Admitting: Family Medicine

## 2015-12-30 VITALS — BP 142/72 | HR 73 | Temp 97.5°F | Resp 16 | Ht 69.02 in | Wt 204.2 lb

## 2015-12-30 DIAGNOSIS — I10 Essential (primary) hypertension: Secondary | ICD-10-CM

## 2015-12-30 DIAGNOSIS — E669 Obesity, unspecified: Secondary | ICD-10-CM | POA: Insufficient documentation

## 2015-12-30 DIAGNOSIS — K219 Gastro-esophageal reflux disease without esophagitis: Secondary | ICD-10-CM

## 2015-12-30 DIAGNOSIS — E7800 Pure hypercholesterolemia, unspecified: Secondary | ICD-10-CM | POA: Insufficient documentation

## 2015-12-30 MED ORDER — ATORVASTATIN CALCIUM 40 MG PO TABS
ORAL_TABLET | ORAL | Status: DC
Start: ? — End: 2015-12-30

## 2015-12-30 MED ORDER — POTASSIUM CHLORIDE CRYS ER 10 MEQ PO TBCR
10.0000 meq | EXTENDED_RELEASE_TABLET | Freq: Every day | ORAL | Status: DC
Start: ? — End: 2015-12-30

## 2015-12-30 MED ORDER — OMEPRAZOLE 20 MG PO CPDR
DELAYED_RELEASE_CAPSULE | ORAL | Status: DC
Start: ? — End: 2015-12-30

## 2015-12-30 MED ORDER — DOXAZOSIN MESYLATE 8 MG PO TABS
8.0000 mg | ORAL_TABLET | Freq: Every evening | ORAL | Status: DC
Start: ? — End: 2015-12-30

## 2015-12-30 MED ORDER — LISINOPRIL-HYDROCHLOROTHIAZIDE 10-12.5 MG PO TABS
ORAL_TABLET | ORAL | Status: DC
Start: ? — End: 2015-12-30

## 2015-12-30 NOTE — Progress Notes (Signed)
Date Specimen Drawn:  12/30/2015   Time Specimen Drawn:  10:26 AM   Test(s) Ordered:  CBC,BMP,LIPIDS   Disposition:  n/a   Patient's Tolerance:  Good   Location Specimen Drawn:  Right antecubital

## 2015-12-30 NOTE — Progress Notes (Signed)
Progress Note: 12/30/2015     Last Core Note 12/30/2015 , next Core f/u due 06/28/16        Noah Ali,   Date of birth: February 26, 1951, 65 y.o., male   Todays labs were drawn while nonfasting and goal non HDL < 160    "Results Review" selected to review presence/absence of prior labs                Addendums: if there any, they can be found in the Problem Summary List under "Dr. Burnadette Pop Health Maintenance/Addendums/Outside of [facility] & other Studies/Fhx/Shx/Vaccinations/Surgeries"    BP 142/72 mmHg  Pulse 73  Temp(Src) 97.5 F (36.4 C) (Tympanic)  Resp 16  Ht 1.753 m (5' 9.02")  Wt 92.647 kg (204 lb 4 oz)  BMI 30.15 kg/m2  SpO2 94%     Subjective:   This 65 y.o. male presents for evaluation of the following medical condition(s):    1) Hypertension on the medications listed with good compliance. Pt is monitoring home blood pressures with most < 140/90. Most office blood pressure readings are <150/90.   BP Readings from Last 3 Encounters:   12/30/15 142/72   03/25/15 124/70   07/23/14 120/60       2) Hyperlipidemia on Lipitor 40 started at the time of his visit 12/14 when NIH MI risk for 10 years based on risk calculator was 11%.    3) GERD s/p EGD 5/06 on prn prilosec and under good control; averages 2 pills/week.     4) Chronic CPK elevations, mild with normal values 3 since December 2014.     Denies myalgias or arthralgias. CPK last visit was normal, although mildly elevated the time before     5) Mild, intermittent anemia with neg w/u. All CBCs since 5/14 have been normal with a total of 4 performed since that date    6) LBP, currently not a problem although it does flare intermittently with last flare a week ago.     7) Overweight and not on a diet ; we have advised low carb diet.   Wt Readings from Last 3 Encounters:   12/30/15 92.647 kg (204 lb 4 oz)   03/25/15 92.08 kg (203 lb)   07/23/14 94.711 kg (208 lb 12.8 oz)       Review of Systems.   Denies chest pain, shortness of breath, side effects,  or past medical history of CAD, CVA, or PAD.     OBJECTIVE:   -Overweight Pt in NAD   -Neck: supple, without bruits   -Respiratory: CTA   -Cardiovascular: RRR with 1/6 SEM precordial area w/o gallops   -Abdomen: soft and nontender   -Extremities: Without edema     Assessment/Plan: Labs were drawn while fasting.   1) Hypertension with good control   -Exercise, prescription and diet are key.   F/U in 6 months    2) Hyperlipidemia; labs today     3) GERD s/p EGD 5/06 on prn prilosec and under good control; the patient is aware that PPIs increases risk for pneumonia, infectious diarrhea and osteoporosis. He is aware that he should cut back to the least dose that keeps his symptoms under control     4) Chronic CPK elevations, mild. Will stop checking CPKs since he has had multiple normal values consecutively    5) Mild, intermittent anemia with neg w/u and normal CBCs recently.    6) LBP, currently not a problem.     7)  Non morbid Obesity; advise low-carb diet    Very soft heart murmur. Patient states that he has had an echocardiogram in the past, but I do not see the results. We will see if we can get them in the archives from [facility]    Patient declines colonoscopy, flu shot, pneumonia vaccines today states that he will consider them at future visits    Fall risk negligable: the patient was evaluated for risk for falls and determined not to be at risk.  Timed (20 seconds) get up and go test was negative.    Follow-up 6 months          ---------------------------------------------------------------------------------------------------------   The Progress  note for today is completed above in a SOAP note format. The patients allergies, med list, and Problem Summary List are recorded below.  ---------------------------------------------------------------------------------------------------------      For those patients followed by me, please go to the problem summary list and look under 'Overview' "Dr. Burnadette Pop  Health Maintenance/Addendums/Outside of [facility] & other Studies/Fhx/Shx/Vaccinations/Surgeries" which was reviewed and updated (if indicated) this date    No Known Allergies    Current Outpatient Prescriptions on File Prior to Visit   Medication Sig Dispense Refill   . aspirin EC 81 MG EC tablet Take 81 mg by mouth daily.     Marland Kitchen atorvastatin (LIPITOR) 40 MG tablet TAKE 1 TABLET BY MOUTH ONE TIME DAILY 30 tablet 0   . doxazosin (CARDURA) 8 MG tablet Take 1 tablet (8 mg total) by mouth nightly. 30 tablet 0   . lisinopril-hydroCHLOROthiazide (PRINZIDE,ZESTORETIC) 10-12.5 MG per tablet TAKE 1 TABLET BY MOUTH ONE TIME DAILY (PLEASE SCHEDULE A FOLLOW UP VISIT) 30 tablet 0   . omeprazole (PRILOSEC) 20 MG capsule TAKE 1 CAPSULE BY MOUTH DAILY AS NEEDED 30 capsule 0   . potassium chloride (K-TAB,KLOR-CON) 10 MEQ tablet Take 1 tablet (10 mEq total) by mouth daily. POTASSIUM CHLORIDE ER TAB GX K-TAB 90 tablet 1   . [DISCONTINUED] potassium chloride (K-TAB,KLOR-CON) 10 MEQ tablet Take 1 tablet (10 mEq total) by mouth daily. 30 tablet 0     No current facility-administered medications on file prior to visit.       Patient Active Problem List   Diagnosis   . Pure hypercholesterolemia   . CPK elevation, chronic   . Esophageal reflux s/p EGD 5/06   . Headache(784.0)   . HTN   . Lumbago   . Anemia, neg w/u in past   . Undiagnosed cardiac murmurs   . Dr. Burnadette Pop Health Maintenance/Addendums/Outside of [facility] & other Studies/Fhx/Shx/Vaccinations/Surgeries   . Obesity, non morbid       RESULTRCNT(52w)    This note was completed using Engineer, production.  Grammatical errors, random word insertions, pronoun errors, incomplete sentences, and other errors are possible due to software limitations.  It is possible that I may have missed such errors when carefully reviewing the note. If the reader has questions or concerns regarding these errors or this note, please address them with  me for clarification.     Darlina Sicilian, MD

## 2015-12-31 LAB — CBC AND DIFFERENTIAL
Basophils %: 0.5 % (ref 0.0–3.0)
Basophils Absolute: 0 10*3/uL (ref 0.0–0.3)
Eosinophils %: 3.7 % (ref 0.0–7.0)
Eosinophils Absolute: 0.2 10*3/uL (ref 0.0–0.8)
Hematocrit: 43.6 % (ref 39.0–52.5)
Hemoglobin: 14.4 gm/dL (ref 13.0–17.5)
Lymphocytes Absolute: 1.1 10*3/uL (ref 0.6–5.1)
Lymphocytes: 20.3 % (ref 15.0–46.0)
MCH: 30 pg (ref 28–35)
MCHC: 33 gm/dL (ref 32–36)
MCV: 92 fL (ref 80–100)
MPV: 10 fL (ref 6.0–10.0)
Monocytes Absolute: 0.5 10*3/uL (ref 0.1–1.7)
Monocytes: 10.2 % (ref 3.0–15.0)
Neutrophils %: 65.3 % (ref 42.0–78.0)
Neutrophils Absolute: 3.5 10*3/uL (ref 1.7–8.6)
PLT CT: 159 10*3/uL (ref 130–440)
RBC: 4.75 10*6/uL (ref 4.00–5.70)
RDW: 12.7 % (ref 11.0–14.0)
WBC: 5.3 10*3/uL (ref 4.0–11.0)

## 2015-12-31 LAB — BASIC METABOLIC PANEL
Anion Gap: 11 mMol/L (ref 7.0–18.0)
BUN / Creatinine Ratio: 13.7 Ratio (ref 10.0–30.0)
BUN: 17 mg/dL (ref 7–22)
CO2: 25.7 mMol/L (ref 20.0–30.0)
Calcium: 9.6 mg/dL (ref 8.5–10.5)
Chloride: 105 mMol/L (ref 98–110)
Creatinine: 1.24 mg/dL (ref 0.80–1.30)
EGFR: 59 mL/min/{1.73_m2}
Glucose: 87 mg/dL (ref 70–99)
Osmolality Calc: 275 mOsm/kg (ref 275–300)
Potassium: 4.7 mMol/L (ref 3.5–5.3)
Sodium: 137 mMol/L (ref 136–147)

## 2015-12-31 LAB — LIPD PANEL WITH NON-HDL CHOLESTEROL, SERUM
Cholesterol: 155 mg/dL (ref 75–199)
Coronary Heart Disease Risk: 2.38
HDL: 65 mg/dL — ABNORMAL HIGH (ref 40–55)
LDL Calculated: 81 mg/dL
NON HDL CHOLESTEROL: 90 mg/dL
Triglycerides: 45 mg/dL (ref 10–150)
VLDL: 9 (ref 0–40)

## 2016-01-03 ENCOUNTER — Other Ambulatory Visit (RURAL_HEALTH_CENTER): Payer: Self-pay

## 2016-01-03 MED ORDER — POTASSIUM CHLORIDE CRYS ER 10 MEQ PO TBCR
10.0000 meq | EXTENDED_RELEASE_TABLET | Freq: Every day | ORAL | Status: DC
Start: ? — End: 2016-01-03

## 2016-01-03 MED ORDER — DOXAZOSIN MESYLATE 8 MG PO TABS
8.0000 mg | ORAL_TABLET | Freq: Every evening | ORAL | Status: DC
Start: ? — End: 2016-01-03

## 2016-01-03 MED ORDER — ATORVASTATIN CALCIUM 40 MG PO TABS
ORAL_TABLET | ORAL | Status: DC
Start: ? — End: 2016-01-03

## 2016-01-03 MED ORDER — OMEPRAZOLE 20 MG PO CPDR
DELAYED_RELEASE_CAPSULE | ORAL | Status: DC
Start: ? — End: 2016-01-03

## 2016-01-03 MED ORDER — LISINOPRIL-HYDROCHLOROTHIAZIDE 10-12.5 MG PO TABS
ORAL_TABLET | ORAL | Status: DC
Start: ? — End: 2016-01-03

## 2016-01-14 ENCOUNTER — Encounter (RURAL_HEALTH_CENTER): Payer: Self-pay | Admitting: Family Medicine

## 2016-01-14 DIAGNOSIS — I351 Nonrheumatic aortic (valve) insufficiency: Secondary | ICD-10-CM | POA: Insufficient documentation

## 2016-03-14 ENCOUNTER — Ambulatory Visit (RURAL_HEALTH_CENTER): Payer: 59 | Admitting: Family Medicine

## 2016-03-14 ENCOUNTER — Encounter (RURAL_HEALTH_CENTER): Payer: Self-pay | Admitting: Family Medicine

## 2016-03-14 ENCOUNTER — Telehealth (RURAL_HEALTH_CENTER): Payer: Self-pay | Admitting: Family Medicine

## 2016-03-14 VITALS — BP 128/80 | HR 74 | Temp 98.6°F | Resp 16 | Ht 69.0 in | Wt 204.0 lb

## 2016-03-14 DIAGNOSIS — M25562 Pain in left knee: Secondary | ICD-10-CM

## 2016-03-14 NOTE — Telephone Encounter (Signed)
Xray left knee: arthritis, loose body; keep appt with ortho---jwt

## 2016-03-14 NOTE — Progress Notes (Signed)
Progress Note: 03/14/2016   Last Core Note 12/30/2015 , next Core f/u due 06/28/16      Noah Ali,   Date of birth: [birth year], 65 y.o., male       "Results Review" selected to review presence/absence of prior labs                Addendums: if there any, they can be found in the Problem Summary List under "Dr. Burnadette Pop Health Maintenance/Addendums/Outside of [facility] & other Studies/Fhx/Shx/Vaccinations/Surgeries"    BP 128/80 mmHg  Pulse 74  Temp(Src) 98.6 F (37 C) (Tympanic)  Resp 16  Ht 1.753 m (5\' 9" )  Wt 92.534 kg (204 lb)  BMI 30.11 kg/m2  SpO2 98%     Subjective:   This 65 y.o. male presents for evaluation of the following medical condition(s):    1) Left knee pain with onset about a week ago. He states that years ago he had to have fluid pulled off the knee, but no problems up until his current problems. It is difficult for him to extend the knee, and it hurts to walk on it. His knee did give out yesterday and he almost fell    Denies known injury, other joints causing problems.    Review of Systems/Elements of the HPI:  Per subjective above.      Objective: (jwtprocedure--abscess, ingrown toenail, cerumen, joint injection)  WDWN pt in NAD except he is hobbling favoring his left knee  -Head: NC/AT  -Neck: Supple.     -Lungs: CTA  -CV: RRR with 1/6 systolic ejection murmur aortic area  -Left knee: No swelling, but very tender to touch in all areas including the tibial plateaus, and the kneecap itself. There is valgus and varus discomfort located medially and laterally. Negative anterior and posterior drawer, quads and patellar apprehension tests    Assessment/Plan:(.jwtapprocedure)  1) Left knee pain with the knee giving out and near falls.  Tylenol around the clock (appropriate dosing discussed and not within 8 hours of other tylenol containing products)  SAM-E  Ice treatments (consider gel pack in pouch, apply up to 20 minutes TID)  Crutches; advised no weightbearing for the next 3 days,  then with weightbearing for the following 3 days, and then finally off crutches. If unable to proceed to the next step, advise patient to go back to the last step while using the crutches which provided relief, and call us back for further instruction   Pt is not requesting time off from school/work   Referral to orthopedics           ---------------------------------------------------------------------------------------------------------   The Progress  note for today is completed above in a SOAP note format. The patients allergies, med list, and Problem Summary List are recorded below.  ---------------------------------------------------------------------------------------------------------      For those patients followed by me, please go to the problem summary list and look under 'Overview' "Dr. Burnadette Pop Health Maintenance/Addendums/Outside of [facility] & other Studies/Fhx/Shx/Vaccinations/Surgeries" which was reviewed and updated (if indicated) this date    No Known Allergies    Current Outpatient Prescriptions on File Prior to Visit   Medication Sig Dispense Refill   . aspirin EC 81 MG EC tablet Take 81 mg by mouth daily.     Marland Kitchen atorvastatin (LIPITOR) 40 MG tablet TAKE 1 TABLET BY MOUTH ONE TIME DAILY 90 tablet 1   . doxazosin (CARDURA) 8 MG tablet Take 1 tablet (8 mg total) by mouth nightly. 90 tablet 1   .  lisinopril-hydroCHLOROthiazide (PRINZIDE,ZESTORETIC) 10-12.5 MG per tablet TAKE 1 TABLET BY MOUTH ONE TIME DAILY (PLEASE SCHEDULE A FOLLOW UP VISIT) 90 tablet 1   . omeprazole (PRILOSEC) 20 MG capsule TAKE 1 CAPSULE BY MOUTH DAILY AS NEEDED 90 capsule 0   . potassium chloride (K-TAB,KLOR-CON) 10 MEQ tablet Take 1 tablet (10 mEq total) by mouth daily. POTASSIUM CHLORIDE ER TAB GX K-TAB 90 tablet 1     No current facility-administered medications on file prior to visit.       Patient Active Problem List   Diagnosis   . Pure hypercholesterolemia   . CPK elevation, chronic   . Esophageal reflux  s/p EGD 5/06   . Headache(784.0)   . HTN   . Lumbago   . Anemia, neg w/u in past   . Undiagnosed cardiac murmurs   . Dr. Burnadette Pop Health Maintenance/Addendums/Outside of [facility] & other Studies/Fhx/Shx/Vaccinations/Surgeries   . Obesity, non morbid   . Aortic insufficiency-4/13 echo       RESULTRCNT(52w)    This note was completed using Engineer, production.  Grammatical errors, random word insertions, pronoun errors, incomplete sentences, and other errors are possible due to software limitations.  It is possible that I may have missed such errors when carefully reviewing the note. If the reader has questions or concerns regarding these errors or this note, please address them with me for clarification.     Darlina Sicilian, MD

## 2016-03-15 ENCOUNTER — Other Ambulatory Visit (RURAL_HEALTH_CENTER): Payer: Self-pay

## 2016-03-15 DIAGNOSIS — M25562 Pain in left knee: Secondary | ICD-10-CM

## 2016-03-15 NOTE — Telephone Encounter (Signed)
The patient has been notified of this information and all questions answered.

## 2016-06-29 ENCOUNTER — Ambulatory Visit (RURAL_HEALTH_CENTER): Payer: 59 | Admitting: Family Medicine

## 2016-07-22 ENCOUNTER — Other Ambulatory Visit (RURAL_HEALTH_CENTER): Payer: Self-pay | Admitting: Family Medicine

## 2016-07-27 ENCOUNTER — Ambulatory Visit (RURAL_HEALTH_CENTER): Payer: 59 | Admitting: Family Medicine

## 2016-07-27 ENCOUNTER — Encounter (RURAL_HEALTH_CENTER): Payer: Self-pay | Admitting: Family Medicine

## 2016-07-27 ENCOUNTER — Other Ambulatory Visit
Admission: RE | Admit: 2016-07-27 | Discharge: 2016-07-27 | Disposition: A | Discharge status: Home / Self Care | Payer: 59 | Source: Ambulatory Visit | Attending: Family Medicine | Admitting: Family Medicine

## 2016-07-27 VITALS — BP 118/70 | HR 70 | Temp 98.0°F | Resp 16 | Ht 69.0 in | Wt 207.0 lb

## 2016-07-27 DIAGNOSIS — E669 Obesity, unspecified: Secondary | ICD-10-CM

## 2016-07-27 DIAGNOSIS — I10 Essential (primary) hypertension: Secondary | ICD-10-CM

## 2016-07-27 DIAGNOSIS — I351 Nonrheumatic aortic (valve) insufficiency: Secondary | ICD-10-CM

## 2016-07-27 DIAGNOSIS — E7800 Pure hypercholesterolemia, unspecified: Secondary | ICD-10-CM | POA: Insufficient documentation

## 2016-07-27 DIAGNOSIS — K219 Gastro-esophageal reflux disease without esophagitis: Secondary | ICD-10-CM

## 2016-07-27 LAB — BASIC METABOLIC PANEL
Anion Gap: 14.2 mMol/L (ref 7.0–18.0)
BUN / Creatinine Ratio: 14.4 Ratio (ref 10.0–30.0)
BUN: 20 mg/dL (ref 7–22)
CO2: 20.3 mMol/L (ref 20.0–30.0)
Calcium: 8.9 mg/dL (ref 8.5–10.5)
Chloride: 107 mMol/L (ref 98–110)
Creatinine: 1.39 mg/dL — ABNORMAL HIGH (ref 0.80–1.30)
EGFR: 53 mL/min/{1.73_m2} — ABNORMAL LOW (ref 60–150)
Glucose: 85 mg/dL (ref 70–99)
Osmolality Calc: 276 mOsm/kg (ref 275–300)
Potassium: 4.5 mMol/L (ref 3.5–5.3)
Sodium: 137 mMol/L (ref 136–147)

## 2016-07-27 LAB — CBC AND DIFFERENTIAL
Basophils %: 0.4 % (ref 0.0–3.0)
Basophils Absolute: 0 10*3/uL (ref 0.0–0.3)
Eosinophils %: 7 % (ref 0.0–7.0)
Eosinophils Absolute: 0.4 10*3/uL (ref 0.0–0.8)
Hematocrit: 42.1 % (ref 39.0–52.5)
Hemoglobin: 13.6 gm/dL (ref 13.0–17.5)
Lymphocytes Absolute: 1 10*3/uL (ref 0.6–5.1)
Lymphocytes: 19.9 % (ref 15.0–46.0)
MCH: 30 pg (ref 28–35)
MCHC: 32 gm/dL (ref 32–36)
MCV: 92 fL (ref 80–100)
MPV: 9.7 fL (ref 6.0–10.0)
Monocytes Absolute: 0.5 10*3/uL (ref 0.1–1.7)
Monocytes: 9.4 % (ref 3.0–15.0)
Neutrophils %: 63.2 % (ref 42.0–78.0)
Neutrophils Absolute: 3.3 10*3/uL (ref 1.7–8.6)
PLT CT: 162 10*3/uL (ref 130–440)
RBC: 4.59 10*6/uL (ref 4.00–5.70)
RDW: 12.2 % (ref 11.0–14.0)
WBC: 5.2 10*3/uL (ref 4.0–11.0)

## 2016-07-27 LAB — LIPD PANEL WITH NON-HDL CHOLESTEROL, SERUM
Cholesterol: 168 mg/dL (ref 75–199)
Coronary Heart Disease Risk: 3.17
HDL: 53 mg/dL (ref 40–55)
LDL Calculated: 104 mg/dL
NON HDL CHOLESTEROL: 115 mg/dL
Triglycerides: 56 mg/dL (ref 10–150)
VLDL: 11 (ref 0–40)

## 2016-07-27 MED ORDER — POTASSIUM CHLORIDE ER 10 MEQ PO TBCR
EXTENDED_RELEASE_TABLET | ORAL | Status: DC
Start: ? — End: 2016-07-27

## 2016-07-27 MED ORDER — LISINOPRIL-HYDROCHLOROTHIAZIDE 10-12.5 MG PO TABS
ORAL_TABLET | ORAL | Status: DC
Start: ? — End: 2016-07-27

## 2016-07-27 MED ORDER — DOXAZOSIN MESYLATE 8 MG PO TABS
ORAL_TABLET | ORAL | Status: DC
Start: ? — End: 2016-07-27

## 2016-07-27 MED ORDER — ATORVASTATIN CALCIUM 40 MG PO TABS
ORAL_TABLET | ORAL | Status: DC
Start: ? — End: 2016-07-27

## 2016-07-27 NOTE — Progress Notes (Signed)
Date Specimen Drawn:  07/27/2016   Time Specimen Drawn:  8:35 AM   Test(s) Ordered:  Cbc, bmp, lipd   Disposition:  n/a   Patient's Tolerance:  Good   Location Specimen Drawn:  Right antecubital

## 2016-07-27 NOTE — Progress Notes (Signed)
Progress Note: 07/27/2016     Last Core Note 07/27/2016 , next Core f/u due 01/24/17          Noah Ali,   Date of birth: Apr 16, 1951, 65 y.o., male   Todays labs were drawn while nonfasting, goal non HDL < 160  and is on a statin   "Results Review" selected to review presence/absence of prior labs                Addendums: if there any, they can be found in the Problem Summary List under "Dr. Burnadette Pop Health Maintenance/Addendums/Outside of [facility] & other Studies/Fhx/Shx/Vaccinations/Surgeries"    BP 118/70 mmHg  Pulse 70  Temp(Src) 98 F (36.7 C) (Tympanic)  Resp 16  Ht 1.753 m (5\' 9" )  Wt 93.895 kg (207 lb)  BMI 30.55 kg/m2  SpO2 95%       Subjective:   This 65 y.o. male presents for evaluation of the following medical condition(s):    1) Hypertension on the medications listed with good compliance. Pt is monitoring home blood pressures with most < 140/90. Most office blood pressure readings are <140/90.   BP Readings from Last 3 Encounters:   07/27/16 118/70   03/14/16 128/80   12/30/15 142/72       2) Hyperlipidemia on Lipitor 40 started at the time of his visit 12/14 when NIH MI risk for 10 years based on cholesterol risk calculator was 11%. He has had fantastic lipid profiles consecutively since on Lipitor     3) GERD s/p EGD 5/06 on prn prilosec and under good control; averages 1-2 pills/week.     4) Chronic CPK elevations, mild with normal values 3 since December 2014. We are no longer checking    Denies myalgias or arthralgias. CPK last visit was normal, although mildly elevated the time before     5) Mild, intermittent anemia with neg w/u. All CBCs since 5/14 have been normal with multiple studies performed since that date     6) LBP, currently not a problem although it does flare intermittently with last flare a week ago. Left knee pain also better at this time status post arthroscopy May 2017     7) Overweight and not on a diet ; we have advised low carb diet.   Wt Readings from Last 3  Encounters:   07/27/16 93.895 kg (207 lb)   03/14/16 92.534 kg (204 lb)   12/30/15 92.647 kg (204 lb 4 oz)     8) Mild aortic insufficiency based on echocardiogram 4/13. He is due for repeat echo    Review of Systems.   Denies chest pain, shortness of breath, side effects, or past medical history of CAD, CVA, or PAD.     OBJECTIVE:   -Overweight Pt in NAD   -Neck: supple, without bruits   -Respiratory: CTA   -Cardiovascular: RRR with 1/6 SEM precordial area w/o gallops   -Abdomen: soft and nontender   -Extremities: Without edema     Assessment/Plan: Labs were drawn while fasting.   1) Hypertension with good control   -Exercise, prescription and diet are key.   F/U in 6 months    2) Hyperlipidemia; labs today     3) GERD s/p EGD 5/06 on prn prilosec and under good control; the patient is aware that PPIs increases risk for pneumonia, infectious diarrhea and osteoporosis. He is aware that he should cut back to the least dose that keeps his symptoms under control  4) Chronic CPK elevations, mild. Will stop checking CPKs since he has had multiple normal values consecutively    5) Mild, intermittent anemia with neg w/u and normal CBCs recently.    6) LBP, currently not a problem.     7) Non morbid Obesity; again advise low-carb diet    8) Aortic insufficiency mild 4/13; repeat echo.    Patient declines colonoscopy, flu shot, pneumonia vaccines today states that he will consider them at future visits    Fall risk negligable: the patient was evaluated for risk for falls and determined not to be at risk. Timed (20 seconds) get up and go test was negative.    Follow-up 6 months           ---------------------------------------------------------------------------------------------------------   The Progress  note for today is completed above in a SOAP note format. The patients allergies, med list, and Problem Summary List are recorded  below.  ---------------------------------------------------------------------------------------------------------      For those patients followed by me, please go to the problem summary list and look under 'Overview' "Dr. Burnadette Pop Health Maintenance/Addendums/Outside of [facility] & other Studies/Fhx/Shx/Vaccinations/Surgeries" which was reviewed and updated (if indicated) this date    No Known Allergies    Current Outpatient Prescriptions on File Prior to Visit   Medication Sig Dispense Refill   . aspirin EC 81 MG EC tablet Take 81 mg by mouth daily.     Marland Kitchen atorvastatin (LIPITOR) 40 MG tablet TAKE 1 TABLET BY MOUTH ONE TIME DAILY 90 tablet 1   . doxazosin (CARDURA) 8 MG tablet TAKE 1 TABLET BY MOUTH NIGHTLY 30 tablet 0   . lisinopril-hydroCHLOROthiazide (PRINZIDE,ZESTORETIC) 10-12.5 MG per tablet TAKE 1 TABLET BY MOUTH ONE TIME DAILY (PLEASE SCHEDULE A FOLLOW UP VISIT) 90 tablet 1   . omeprazole (PRILOSEC) 20 MG capsule TAKE 1 CAPSULE BY MOUTH DAILY AS NEEDED 90 capsule 0   . potassium chloride (K-TAB, KLOR-CON) 10 MEQ tablet TAKE 1 TABLET BY MOUTH DAILY 30 tablet 0     No current facility-administered medications on file prior to visit.       Patient Active Problem List   Diagnosis   . Pure hypercholesterolemia   . CPK elevation, chronic   . Esophageal reflux s/p EGD 5/06   . Headache(784.0)   . HTN   . Lumbago   . Anemia, neg w/u in past   . Dr. Burnadette Pop Health Maintenance/Addendums/Outside of [facility] & other Studies/Fhx/Shx/Vaccinations/Surgeries   . Obesity, non morbid   . Aortic insufficiency-4/13 echo       RESULTRCNT(52w)    This note was completed using Engineer, production.  Grammatical errors, random word insertions, pronoun errors, incomplete sentences, and other errors are possible due to software limitations.  It is possible that I may have missed such errors when carefully reviewing the note. If the reader has questions or concerns regarding these errors  or this note, please address them with me for clarification.     Darlina Sicilian, MD

## 2016-08-07 ENCOUNTER — Other Ambulatory Visit: Payer: 59

## 2016-08-22 ENCOUNTER — Ambulatory Visit
Admission: RE | Admit: 2016-08-22 | Discharge: 2016-08-22 | Disposition: A | Discharge status: Home / Self Care | Payer: 59 | Source: Ambulatory Visit | Attending: Family Medicine | Admitting: Family Medicine

## 2016-08-22 DIAGNOSIS — I083 Combined rheumatic disorders of mitral, aortic and tricuspid valves: Secondary | ICD-10-CM | POA: Insufficient documentation

## 2016-08-22 DIAGNOSIS — I351 Nonrheumatic aortic (valve) insufficiency: Secondary | ICD-10-CM

## 2016-08-22 DIAGNOSIS — E785 Hyperlipidemia, unspecified: Secondary | ICD-10-CM | POA: Insufficient documentation

## 2016-08-22 DIAGNOSIS — I10 Essential (primary) hypertension: Secondary | ICD-10-CM | POA: Insufficient documentation

## 2017-01-01 ENCOUNTER — Encounter (RURAL_HEALTH_CENTER): Payer: Self-pay | Admitting: Family Medicine

## 2017-01-01 ENCOUNTER — Ambulatory Visit (RURAL_HEALTH_CENTER): Payer: 59 | Admitting: Family Medicine

## 2017-01-01 ENCOUNTER — Other Ambulatory Visit
Admission: RE | Admit: 2017-01-01 | Discharge: 2017-01-01 | Disposition: A | Discharge status: Home / Self Care | Payer: 59 | Source: Ambulatory Visit | Attending: Family Medicine | Admitting: Family Medicine

## 2017-01-01 VITALS — BP 160/80 | HR 99 | Temp 98.2°F | Resp 16 | Ht 69.0 in | Wt 206.0 lb

## 2017-01-01 DIAGNOSIS — I739 Peripheral vascular disease, unspecified: Secondary | ICD-10-CM | POA: Insufficient documentation

## 2017-01-01 DIAGNOSIS — I2699 Other pulmonary embolism without acute cor pulmonale: Secondary | ICD-10-CM

## 2017-01-01 DIAGNOSIS — I10 Essential (primary) hypertension: Secondary | ICD-10-CM

## 2017-01-01 DIAGNOSIS — N179 Acute kidney failure, unspecified: Secondary | ICD-10-CM

## 2017-01-01 DIAGNOSIS — N281 Cyst of kidney, acquired: Secondary | ICD-10-CM

## 2017-01-01 DIAGNOSIS — E8809 Other disorders of plasma-protein metabolism, not elsewhere classified: Secondary | ICD-10-CM

## 2017-01-01 LAB — BASIC METABOLIC PANEL
Anion Gap: 13.2 mMol/L (ref 7.0–18.0)
BUN / Creatinine Ratio: 10.9 Ratio (ref 10.0–30.0)
BUN: 14 mg/dL (ref 7–22)
CO2: 21 mMol/L (ref 20.0–30.0)
Calcium: 8.7 mg/dL (ref 8.5–10.5)
Chloride: 107 mMol/L (ref 98–110)
Creatinine: 1.29 mg/dL (ref 0.80–1.30)
EGFR: 57 mL/min/{1.73_m2} — ABNORMAL LOW (ref 60–150)
Glucose: 95 mg/dL (ref 70–99)
Osmolality Calc: 274 mOsm/kg — ABNORMAL LOW (ref 275–300)
Potassium: 4.2 mMol/L (ref 3.5–5.3)
Sodium: 137 mMol/L (ref 136–147)

## 2017-01-01 LAB — CBC AND DIFFERENTIAL
Basophils %: 0.2 % (ref 0.0–3.0)
Basophils Absolute: 0 10*3/uL (ref 0.0–0.3)
Eosinophils %: 13.4 % — ABNORMAL HIGH (ref 0.0–7.0)
Eosinophils Absolute: 0.9 10*3/uL — ABNORMAL HIGH (ref 0.0–0.8)
Hematocrit: 38.1 % — ABNORMAL LOW (ref 39.0–52.5)
Hemoglobin: 12.6 gm/dL — ABNORMAL LOW (ref 13.0–17.5)
Lymphocytes Absolute: 0.9 10*3/uL (ref 0.6–5.1)
Lymphocytes: 13.9 % — ABNORMAL LOW (ref 15.0–46.0)
MCH: 29 pg (ref 28–35)
MCHC: 33 gm/dL (ref 32–36)
MCV: 87 fL (ref 80–100)
MPV: 8.5 fL (ref 6.0–10.0)
Monocytes Absolute: 0.7 10*3/uL (ref 0.1–1.7)
Monocytes: 9.9 % (ref 3.0–15.0)
Neutrophils %: 62.6 % (ref 42.0–78.0)
Neutrophils Absolute: 4.3 10*3/uL (ref 1.7–8.6)
PLT CT: 254 10*3/uL (ref 130–440)
RBC: 4.39 10*6/uL (ref 4.00–5.70)
RDW: 11.7 % (ref 11.0–14.0)
WBC: 6.8 10*3/uL (ref 4.0–11.0)

## 2017-01-01 MED ORDER — OMEPRAZOLE 20 MG PO CPDR
DELAYED_RELEASE_CAPSULE | ORAL | 1 refills | Status: DC
Start: ? — End: 2017-01-01

## 2017-01-01 MED ORDER — AMLODIPINE BESYLATE 5 MG PO TABS
5.0000 mg | ORAL_TABLET | Freq: Every day | ORAL | 3 refills | Status: DC
Start: ? — End: 2017-01-01

## 2017-01-01 NOTE — Progress Notes (Signed)
Progress Note: 01/01/2017   Last Core Note 07/27/2016 , next Core f/u due 01/24/17      Noah Ali,   Date of birth: 1951-01-04, 66 y.o., male   Todays labs were drawn while nonfasting   "Results Review" selected to review presence/absence of prior labs                Addendums: if there any, they can be found in the Problem Summary List under "Dr. Burnadette Pop Health Maintenance/Addendums/Outside of [facility] & other Studies/Fhx/Shx/Vaccinations/Surgeries"    BP 160/80 (BP Site: Left arm, Patient Position: Sitting, Cuff Size: Medium)   Pulse 99   Temp 98.2 F (36.8 C) (Tympanic)   Resp 16   Ht 1.753 m (5\' 9" )   Wt 93.4 kg (206 lb)   SpO2 100%   BMI 30.42 kg/m        Subjective:   This 66 y.o. male presents for evaluation of the following medical condition(s):    Hospitalization at [facility] for dates 12/25/16 to 12/26/16.  Admission and discharge notes, laboratory and procedure results, and notes were reviewed with relative information incorporated into the following note.  The patient was admitted and evaluated for the following problems:    1) Acute pulmonary embolism which manifested as right flank area discomfort which later progressed to left-sided chest pain. There was an elevated d-dimer with subsequent CT scan showing acute pulmonary embolism.  Patient was started on intravenous heparin. Duplex ultrasound of lower extremities came back normal/negative. Patient was transitioned to Eliquis 5 mg twice daily. There still is a little residual flank area discomfort on the right    Patient has appointment with hematology at [facility] scheduled for 01/10/17    Denies shortness of breath,    2) Acute kidney injury with peak creatinine at 1.76. ACE inhibitor and HCTZ withheld with renal function returning to normal. Discharge creatinine 1.38    Current blood pressure medications his Cardura 8 mg daily at bedtime    3) Renal cyst on the right found on CT at 12.1 by 12.1 cm size with small amount of wall  calcification. Bilateral non-worrisome renal cysts were found as well measuring up to 2.3 cm in size.  Patient has pending consult with urology at [facility] 01/10/17    4) Anemia with discharge H&H 10.9/32.3.  Most recent CBCs with [place] have been normal including 07/27/16 when last performed    5) PAD with mild calcification of the abdominal aorta on CT scan abdomen; he is on Lipitor    6) Decreased albumin/globulin ratio    Normal labs include dip UA, comprehensive metabolic profile other than creatinine, lipase    Patient is wondering if he needs to be on short-term disability. 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 I would leave this decision up to the hematologist as there are plenty of people who were working on this medication, and he has been put off work until he is seen by them    Review of Systems.   Pt does have PAD, and mild stable chest discomfort and shortness of breath since out of the hospital.    Denies Past medical history of CAD, CVA,     OBJECTIVE:   -WDWN male in NAD  -Neck: supple, without bruits   -Respiratory: CTA   -Cardiovascular: RRR with 1/6 SEM precordial area  w/o gallops   -Abdomen: soft and nontender   -Extremities: Without edema     Repeat blood pressure testing 162/90 left arm sitting, right arm sitting within 1 minute 152/88    Assessment/Plan: Labs were drawn while fasting.  1) Acute pulmonary embolism unprovoked; continue Eliquis and keep follow-up with hematology  Advise he remain anticoagulated for at least 3-6 months, but lifelong given unprovoked status is an option to be discussed with hematology    2) Acute kidney injury with peak creatinine at 1.76. ACE inhibitor and HCTZ withheld with renal function returning to normal. Discharge creatinine 1.38  We will repeat kidney function testing today    3) Renal cyst on the right per urology [facility]    4)  Anemia; repeat CBC    5) PAD with mild calcification of the abdominal aorta on CT scan abdomen; he is on Lipitor with goal LDL less than 100    6) Decreased albumin/globulin ratio; we will order a serum immunoelectrophoresis    Patient is wondering if he needs to be on short-term disability. 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 I would leave this decision up to the hematologist as there are plenty of people who were working on this medication, and he has been put off work until he is seen by them    Hypertension taken off of lisinopril/HCTZ. We will place him on Norvasc 5 mg daily, continue Cardura, and keep follow-up 01/24/17    Health Maintenance:    Declines flu shot    .JWTHelpfulCodes (physical exams, time in room, procedure [modifier 25 dx code joint based on size, skin bx, skin tags, ingrown toenail, abscess I&D, cerumen disimpaction, vasectomy])     99214: 25 minutes or more spent with patient, greater than 50% of the office visit was dedicated to counseling, reviewing tests & labs, treatment options and follow up plans.           ---------------------------------------------------------------------------------------------------------   The Progress  note for today is completed above in a SOAP note format. The patients allergies, med list, and Problem Summary List are recorded below.  ---------------------------------------------------------------------------------------------------------      For those patients followed by me, please go to the problem summary list and look under 'Overview' "Dr. Burnadette Pop Health Maintenance/Addendums/Outside of [facility] & other Studies/Fhx/Shx/Vaccinations/Surgeries" which was reviewed and updated (if indicated) this date    No Known Allergies    Current Outpatient Prescriptions on File Prior to Visit   Medication Sig Dispense Refill   . atorvastatin (LIPITOR) 40 MG  tablet TAKE 1 TABLET BY MOUTH ONE TIME DAILY 90 tablet 1   . omeprazole (PRILOSEC) 20 MG capsule TAKE 1 CAPSULE BY MOUTH DAILY AS NEEDED 90 capsule 0   . lisinopril-hydroCHLOROthiazide (PRINZIDE,ZESTORETIC) 10-12.5 MG per tablet TAKE 1 TABLET BY MOUTH ONE TIME DAILY (PLEASE SCHEDULE A FOLLOW UP VISIT) 90 tablet 1   . [DISCONTINUED] aspirin EC 81 MG EC tablet Take 81 mg by mouth daily.     . [DISCONTINUED] doxazosin (CARDURA) 8 MG tablet TAKE 1 TABLET BY MOUTH NIGHTLY 90 tablet 1   . [DISCONTINUED] potassium chloride (K-TAB, KLOR-CON) 10 MEQ tablet TAKE 1 TABLET BY MOUTH DAILY 90 tablet 1     No current facility-administered medications on file prior to visit.        Patient Active Problem List   Diagnosis   .  Pure hypercholesterolemia   . CPK elevation, chronic   . Esophageal reflux s/p EGD 5/06   . Headache(784.0)   . HTN   . Lumbago   . Anemia, neg w/u in past   . Dr. Burnadette Pop Health Maintenance/Addendums/Outside of [facility] & other Studies/Fhx/Shx/Vaccinations/Surgeries   . Obesity, non morbid   . Aortic insufficiency-4/13 echo       RESULTRCNT(52w)    This note was completed using Engineer, production.  Grammatical errors, random word insertions, pronoun errors, incomplete sentences, and other errors are possible due to software limitations.  It is possible that I may have missed such errors when carefully reviewing the note. If the reader has questions or concerns regarding these errors or this note, please address them with me for clarification.     Darlina Sicilian, MD

## 2017-01-01 NOTE — Progress Notes (Signed)
Date Specimen Drawn:  01/01/2017   Time Specimen Drawn:  3:24 PM   Test(s) Ordered:  Bmp, cbc, siep   Disposition:  n/a   Patient's Tolerance:  Good   Location Specimen Drawn:  Right antecubital

## 2017-01-02 ENCOUNTER — Encounter (RURAL_HEALTH_CENTER): Payer: Self-pay

## 2017-01-04 ENCOUNTER — Telehealth (RURAL_HEALTH_CENTER): Payer: Self-pay

## 2017-01-04 LAB — IMMUNOFIXATION ELECTROPHORESIS

## 2017-01-04 NOTE — Telephone Encounter (Signed)
Not all labs back yet, but anemia improved, and good renal function.  Pending is SIEP-----jwt

## 2017-01-04 NOTE — Telephone Encounter (Signed)
Pt called for lab results 

## 2017-01-07 NOTE — Telephone Encounter (Signed)
Made patient aware.

## 2017-01-18 ENCOUNTER — Other Ambulatory Visit (RURAL_HEALTH_CENTER): Payer: Self-pay

## 2017-01-18 MED ORDER — DOXAZOSIN MESYLATE 8 MG PO TABS
8.0000 mg | ORAL_TABLET | Freq: Every evening | ORAL | 0 refills | Status: DC
Start: ? — End: 2017-01-18

## 2017-01-29 ENCOUNTER — Ambulatory Visit (RURAL_HEALTH_CENTER): Payer: 59 | Admitting: Family Medicine

## 2017-02-08 ENCOUNTER — Other Ambulatory Visit
Admission: RE | Admit: 2017-02-08 | Discharge: 2017-02-08 | Disposition: A | Discharge status: Home / Self Care | Payer: 59 | Source: Ambulatory Visit | Attending: Family Medicine | Admitting: Family Medicine

## 2017-02-08 ENCOUNTER — Ambulatory Visit: Payer: 59 | Attending: Family Medicine | Admitting: Family Medicine

## 2017-02-08 ENCOUNTER — Encounter (RURAL_HEALTH_CENTER): Payer: Self-pay | Admitting: Family Medicine

## 2017-02-08 VITALS — BP 138/78 | HR 84 | Temp 98.2°F | Resp 16 | Ht 69.0 in | Wt 207.0 lb

## 2017-02-08 DIAGNOSIS — E669 Obesity, unspecified: Secondary | ICD-10-CM | POA: Insufficient documentation

## 2017-02-08 DIAGNOSIS — I739 Peripheral vascular disease, unspecified: Secondary | ICD-10-CM | POA: Insufficient documentation

## 2017-02-08 DIAGNOSIS — I10 Essential (primary) hypertension: Secondary | ICD-10-CM

## 2017-02-08 DIAGNOSIS — Z23 Encounter for immunization: Secondary | ICD-10-CM

## 2017-02-08 DIAGNOSIS — Z1159 Encounter for screening for other viral diseases: Secondary | ICD-10-CM | POA: Insufficient documentation

## 2017-02-08 DIAGNOSIS — I351 Nonrheumatic aortic (valve) insufficiency: Secondary | ICD-10-CM

## 2017-02-08 DIAGNOSIS — I2699 Other pulmonary embolism without acute cor pulmonale: Secondary | ICD-10-CM

## 2017-02-08 DIAGNOSIS — E7800 Pure hypercholesterolemia, unspecified: Secondary | ICD-10-CM | POA: Insufficient documentation

## 2017-02-08 LAB — CBC AND DIFFERENTIAL
Basophils %: 0 % (ref 0.0–3.0)
Basophils Absolute: 0 10*3/uL (ref 0.0–0.3)
Eosinophils %: 6.7 % (ref 0.0–7.0)
Eosinophils Absolute: 0.3 10*3/uL (ref 0.0–0.8)
Hematocrit: 41.5 % (ref 39.0–52.5)
Hemoglobin: 13.1 gm/dL (ref 13.0–17.5)
Lymphocytes Absolute: 1 10*3/uL (ref 0.6–5.1)
Lymphocytes: 20.9 % (ref 15.0–46.0)
MCH: 28 pg (ref 28–35)
MCHC: 32 gm/dL (ref 32–36)
MCV: 89 fL (ref 80–100)
MPV: 10.7 fL — ABNORMAL HIGH (ref 6.0–10.0)
Monocytes Absolute: 0.6 10*3/uL (ref 0.1–1.7)
Monocytes: 12.3 % (ref 3.0–15.0)
Neutrophils %: 60.1 % (ref 42.0–78.0)
Neutrophils Absolute: 2.7 10*3/uL (ref 1.7–8.6)
PLT CT: 154 10*3/uL (ref 130–440)
RBC: 4.67 10*6/uL (ref 4.00–5.70)
RDW: 13.2 % (ref 11.0–14.0)
WBC: 4.6 10*3/uL (ref 4.0–11.0)

## 2017-02-08 LAB — BASIC METABOLIC PANEL
Anion Gap: 11 mMol/L (ref 7.0–18.0)
BUN / Creatinine Ratio: 11.6 Ratio (ref 10.0–30.0)
BUN: 14 mg/dL (ref 7–22)
CO2: 23.4 mMol/L (ref 20.0–30.0)
Calcium: 8.8 mg/dL (ref 8.5–10.5)
Chloride: 106 mMol/L (ref 98–110)
Creatinine: 1.21 mg/dL (ref 0.80–1.30)
EGFR: 62 mL/min/{1.73_m2} (ref 60–150)
Glucose: 82 mg/dL (ref 70–99)
Osmolality Calc: 272 mOsm/kg — ABNORMAL LOW (ref 275–300)
Potassium: 4.4 mMol/L (ref 3.5–5.3)
Sodium: 136 mMol/L (ref 136–147)

## 2017-02-08 LAB — LIPD PANEL WITH NON-HDL CHOLESTEROL, SERUM
Cholesterol: 121 mg/dL (ref 75–199)
Coronary Heart Disease Risk: 2.28
HDL: 53 mg/dL (ref 40–55)
LDL Calculated: 59 mg/dL
NON HDL CHOLESTEROL: 68 mg/dL
Triglycerides: 44 mg/dL (ref 10–150)
VLDL: 9 (ref 0–40)

## 2017-02-08 LAB — HEPATITIS C ANTIBODY: Hepatitis C Antibody: REACTIVE — AB

## 2017-02-08 NOTE — Progress Notes (Signed)
Date Specimen Drawn:  02/08/2017   Time Specimen Drawn:  10:04 AM   Test(s) Ordered:  Cbc, bmp, lipd, hep c   Disposition:  n/a   Patient's Tolerance:  Good   Location Specimen Drawn:  Right antecubital

## 2017-02-08 NOTE — Addendum Note (Signed)
Addended by: Catalina Gravel on: 02/08/2017 10:07 AM     Modules accepted: Orders

## 2017-02-08 NOTE — Progress Notes (Signed)
Progress Note: 02/08/2017     Last Core Note 02/08/2017 , next Core f/u due 08/11/17           Noah Ali,   Date of birth: 09/23/1951, 66 y.o., male   Todays labs were drawn while nonfasting, goal non HDL < 130 and is on a statin   "Results Review" selected to review presence/absence of prior labs                Addendums: if there any, they can be found in the Problem Summary List under "Dr. Burnadette Pop Health Maintenance/Addendums/Outside of [facility] & other Studies/Fhx/Shx/Vaccinations/Surgeries"    BP 138/78   Pulse 84   Temp 98.2 F (36.8 C) (Tympanic)   Resp 16   Ht 1.753 m (5\' 9" )   Wt 93.9 kg (207 lb)   SpO2 98%   BMI 30.57 kg/m        Subjective:   This 66 y.o. male presents for evaluation of the following medical condition(s):    1) Hypertension on the medications listed with good compliance.  We added norvasc 5 mg daily at his 01/01/17 visit due to elevated blood pressure.  Pt is monitoring home blood pressures with most < 140/90. Most office blood pressure readings are <140/90.   BP Readings from Last 3 Encounters:   02/08/17 138/78   01/01/17 160/80   07/27/16 118/70       In review: Pt experienced acute kidney injury with peak creatinine at 1.76 during admission 1/18. ACE inhibitor and HCTZ withheld with renal function returning to normal. Discharge creatinine 1.38    2) Hyperlipidemia on Lipitor 40 started at the time of his visit 12/14 when NIH MI risk for 10 years based on cholesterol risk calculator was 11%. He has had excellent lipid profiles consecutively since on Lipitor     3) GERD s/p EGD 5/06 on prn prilosec and under good control; averages 1-2 pills/week.    4) Acute nonprovoked pulmonary embolism sustained 1/18 and now on Eliquis 5 mg twice daily. 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 Other tests for hypercoagulation came back negative including factor V  Leiden    In review: found based on CT angiogram chest.  Duplex ultrasound of lower extremities came back normal/negative.  There still is a little residual flank area discomfort on the right    Denies shortness of breath    5) Renal cyst on the right found on CT at 12.1 by 12.1 cm size with small amount of wall calcification. Bilateral non-worrisome renal cysts were found as well measuring up to 2.3 cm in size.  Patient has urology at [facility] 01/10/17 who recommends that he have it removed as soon as he has been on anticoagulation long enough to protect against another PE    6) Pain: LBP, currently not a problem although it does flare intermittently. Left knee pain also better at this time status post arthroscopy May 2017     7) Overweight and not on a diet ; we have advised low carb diet. The most he has ever weighed is 220 lbs  Wt Readings from Last 3 Encounters:   02/08/17 93.9 kg (207 lb)   01/01/17 93.4 kg (206 lb)   07/27/16 93.9 kg (207 lb)       8) Mild aortic insufficiency and mild mitral regurge based on echocardiogram  9/17;  stable from '13 echo.  Next echo due September 2020    9) Chronic CPK elevations, mild with normal values 3 since December 2014. We are no longer checking    Denies myalgias or arthralgias. CPK last visit was normal, although mildly elevated the time before     10) Mild, intermittent anemia with neg w/u. All CBCs since 5/14 had been normal with the exception of February 2018 visit which was a post hospitalization/PE visit.    11) Acute kidney injury with peak creatinine at 1.76 during admission 1/18. ACE inhibitor and HCTZ withheld with renal function returning to normal.  He was placed on Norvasc to replace these other medicines. Discharge creatinine 1.38, and at 1.29 2/18    12) Decreased albumin/globulin ratio 1/18 admission with f/u SIEP nl/neg    Review of Systems.   Denies chest pain, shortness of breath, side effects, or past medical history of CAD, CVA,  or PAD.     OBJECTIVE:   -Overweight Pt in NAD   -Neck: supple, without bruits   -Respiratory: CTA   -Cardiovascular: RRR with 1/6 SEM precordial area w/o gallops   -Abdomen: soft and nontender   -Extremities: Without edema     Assessment/Plan: Labs were drawn while fasting.   1) Hypertension with good control   -Exercise, prescription and diet are key.   F/U in 6 months    2) Hyperlipidemia; labs today     3) GERD s/p EGD 5/06 on prn prilosec and under good control; the patient is aware that PPIs increases risk for pneumonia, infectious diarrhea and osteoporosis. He is aware that he should cut back to the least dose that keeps his symptoms under control   Advise B12 1 mg daily    4) Chronic CPK elevations, mild. We have stopped checking CPKs since he has had multiple normal values consecutively    5) Mild, intermittent anemia with neg w/u; repeat CBC today    6) LBP, currently not a problem.     7) Non morbid obesity; again advise low-carb diet    8) Aortic insufficiency mild 9 /17 with next echo due 9/20    9) Large renal cyst  Pending removal    Patient agrees to Prevnar today. Patient declines colonoscopy, flu shot,     Fall risk negligable: the patient was evaluated for risk for falls and determined not to be at risk. Timed (20 seconds) get up and go test was negative.    Follow-up 6 months    .ccmrevup  Patient declines to sign up for CCM Revup          ---------------------------------------------------------------------------------------------------------   The Progress  note for today is completed above in a SOAP note format. The patients allergies, med list, and Problem Summary List are recorded below.  ---------------------------------------------------------------------------------------------------------      For those patients followed by me, please go to the problem summary list and look under 'Overview' "Dr. Burnadette Pop Health Maintenance/Addendums/Outside of [facility] &  other Studies/Fhx/Shx/Vaccinations/Surgeries" which was reviewed and updated (if indicated) this date    No Known Allergies    Current Outpatient Prescriptions on File Prior to Visit   Medication Sig Dispense Refill   . amLODIPine (NORVASC) 5 MG tablet Take 1 tablet (5 mg total) by mouth daily. 30 tablet 3   . atorvastatin (LIPITOR) 40 MG tablet TAKE 1 TABLET BY MOUTH ONE TIME DAILY 90 tablet 1   . doxazosin (CARDURA) 8 MG tablet Take 1 tablet (8  mg total) by mouth nightly. 90 tablet 0   . ELIQUIS 5 MG TAKE 2 TABLETS BY MOUTH TWICE A DAY FOR 7 DAYS, THEN TAKE 1 TWICE A DAY FOR 21 DAYS  0   . omeprazole (PRILOSEC) 20 MG capsule TAKE 1 CAPSULE BY MOUTH DAILY AS NEEDED 30 capsule 1   . [DISCONTINUED] ondansetron (ZOFRAN-ODT) 4 MG disintegrating tablet dissolve 1 tablet ON TONGUE every 6 hours if needed  0     No current facility-administered medications on file prior to visit.        Patient Active Problem List   Diagnosis   . Pure hypercholesterolemia   . CPK elevation, chronic   . Esophageal reflux s/p EGD 5/06   . Headache(784.0)   . HTN   . Lumbago   . Anemia, neg w/u in past   . Dr. Burnadette Pop Health Maintenance/Addendums/Outside of [facility] & other Studies/Fhx/Shx/Vaccinations/Surgeries   . Non morbid obesity   . Aortic insufficiency-4/13 echo   . Pulmonary embolism 1/18   . Peripheral arterial disease       RESULTRCNT(52w)    This note was completed using Engineer, production.  Grammatical errors, random word insertions, pronoun errors, incomplete sentences, and other errors are possible due to software limitations.  It is possible that I may have missed such errors when carefully reviewing the note. If the reader has questions or concerns regarding these errors or this note, please address them with me for clarification.     Darlina Sicilian, MD

## 2017-02-08 NOTE — Addendum Note (Signed)
Addended by: Darlina Sicilian on: 02/08/2017 09:24 AM     Modules accepted: Orders

## 2017-02-09 ENCOUNTER — Other Ambulatory Visit (RURAL_HEALTH_CENTER): Payer: Self-pay | Admitting: Family Medicine

## 2017-02-09 DIAGNOSIS — R768 Other specified abnormal immunological findings in serum: Secondary | ICD-10-CM

## 2017-02-11 ENCOUNTER — Other Ambulatory Visit
Admission: RE | Admit: 2017-02-11 | Discharge: 2017-02-11 | Disposition: A | Discharge status: Home / Self Care | Payer: 59 | Source: Ambulatory Visit | Attending: Family Medicine | Admitting: Family Medicine

## 2017-02-11 DIAGNOSIS — R768 Other specified abnormal immunological findings in serum: Secondary | ICD-10-CM | POA: Insufficient documentation

## 2017-02-12 LAB — VH HEPATITIS C RNA QUANTITATIVE PCR
HCV RNA PCR Quantitative: <1.18 log IU/mL
HCV RNA, PCR, Quant: <15 IU/mL

## 2017-03-22 ENCOUNTER — Other Ambulatory Visit (RURAL_HEALTH_CENTER): Payer: Self-pay | Admitting: Family Medicine

## 2017-04-21 ENCOUNTER — Other Ambulatory Visit (RURAL_HEALTH_CENTER): Payer: Self-pay | Admitting: Family Medicine

## 2017-06-21 ENCOUNTER — Other Ambulatory Visit (RURAL_HEALTH_CENTER): Payer: Self-pay | Admitting: Family Medicine

## 2017-06-21 MED ORDER — ATORVASTATIN CALCIUM 40 MG PO TABS
ORAL_TABLET | ORAL | 0 refills | Status: DC
Start: ? — End: 2017-06-21

## 2017-06-21 NOTE — Telephone Encounter (Signed)
Per ins must be #30 with refills to R/A---slp

## 2017-07-11 ENCOUNTER — Other Ambulatory Visit (RURAL_HEALTH_CENTER): Payer: Self-pay

## 2017-07-11 MED ORDER — DOXAZOSIN MESYLATE 8 MG PO TABS
ORAL_TABLET | ORAL | 0 refills | Status: DC
Start: ? — End: 2017-07-11

## 2017-08-23 ENCOUNTER — Encounter (RURAL_HEALTH_CENTER): Payer: Self-pay | Admitting: Family Medicine

## 2017-08-23 ENCOUNTER — Ambulatory Visit: Payer: Medicare Other | Attending: Family Medicine | Admitting: Family Medicine

## 2017-08-23 ENCOUNTER — Other Ambulatory Visit
Admission: RE | Admit: 2017-08-23 | Discharge: 2017-08-23 | Disposition: A | Discharge status: Home / Self Care | Payer: Medicare Other | Source: Ambulatory Visit | Attending: Family Medicine | Admitting: Family Medicine

## 2017-08-23 VITALS — BP 128/82 | HR 78 | Temp 97.0°F | Resp 16 | Ht 69.0 in | Wt 206.0 lb

## 2017-08-23 DIAGNOSIS — I351 Nonrheumatic aortic (valve) insufficiency: Secondary | ICD-10-CM

## 2017-08-23 DIAGNOSIS — L80 Vitiligo: Secondary | ICD-10-CM | POA: Insufficient documentation

## 2017-08-23 DIAGNOSIS — I10 Essential (primary) hypertension: Secondary | ICD-10-CM

## 2017-08-23 DIAGNOSIS — I739 Peripheral vascular disease, unspecified: Secondary | ICD-10-CM

## 2017-08-23 DIAGNOSIS — E7800 Pure hypercholesterolemia, unspecified: Secondary | ICD-10-CM

## 2017-08-23 DIAGNOSIS — E669 Obesity, unspecified: Secondary | ICD-10-CM

## 2017-08-23 DIAGNOSIS — K219 Gastro-esophageal reflux disease without esophagitis: Secondary | ICD-10-CM

## 2017-08-23 DIAGNOSIS — Z1211 Encounter for screening for malignant neoplasm of colon: Secondary | ICD-10-CM

## 2017-08-23 DIAGNOSIS — I2699 Other pulmonary embolism without acute cor pulmonale: Secondary | ICD-10-CM

## 2017-08-23 LAB — CBC AND DIFFERENTIAL
Basophils %: 0.7 % (ref 0.0–3.0)
Basophils Absolute: 0 10*3/uL (ref 0.0–0.3)
Eosinophils %: 6.2 % (ref 0.0–7.0)
Eosinophils Absolute: 0.3 10*3/uL (ref 0.0–0.8)
Hematocrit: 43.6 % (ref 39.0–52.5)
Hemoglobin: 14.3 gm/dL (ref 13.0–17.5)
Lymphocytes Absolute: 1 10*3/uL (ref 0.6–5.1)
Lymphocytes: 18.8 % (ref 15.0–46.0)
MCH: 29 pg (ref 28–35)
MCHC: 33 gm/dL (ref 32–36)
MCV: 89 fL (ref 80–100)
MPV: 10.2 fL — ABNORMAL HIGH (ref 6.0–10.0)
Monocytes Absolute: 0.5 10*3/uL (ref 0.1–1.7)
Monocytes: 9.1 % (ref 3.0–15.0)
Neutrophils %: 65.3 % (ref 42.0–78.0)
Neutrophils Absolute: 3.4 10*3/uL (ref 1.7–8.6)
PLT CT: 165 10*3/uL (ref 130–440)
RBC: 4.9 10*6/uL (ref 4.00–5.70)
RDW: 12.4 % (ref 11.0–14.0)
WBC: 5.3 10*3/uL (ref 4.0–11.0)

## 2017-08-23 LAB — BASIC METABOLIC PANEL
Anion Gap: 12.9 mMol/L (ref 7.0–18.0)
BUN / Creatinine Ratio: 8.6 Ratio — ABNORMAL LOW (ref 10.0–30.0)
BUN: 11 mg/dL (ref 7–22)
CO2: 21.3 mMol/L (ref 20.0–30.0)
Calcium: 9.5 mg/dL (ref 8.5–10.5)
Chloride: 108 mMol/L (ref 98–110)
Creatinine: 1.28 mg/dL (ref 0.80–1.30)
EGFR: 58 mL/min/{1.73_m2} — ABNORMAL LOW (ref 60–150)
Glucose: 87 mg/dL (ref 71–99)
Osmolality Calc: 274 mOsm/kg — ABNORMAL LOW (ref 275–300)
Potassium: 4.2 mMol/L (ref 3.5–5.3)
Sodium: 138 mMol/L (ref 136–147)

## 2017-08-23 LAB — LIPD PANEL WITH NON-HDL CHOLESTEROL, SERUM
Cholesterol: 122 mg/dL (ref 75–199)
Coronary Heart Disease Risk: 2.49
HDL: 49 mg/dL (ref 40–55)
LDL Calculated: 61 mg/dL
NON HDL CHOLESTEROL: 73 mg/dL
Triglycerides: 59 mg/dL (ref 10–150)
VLDL: 12 (ref 0–40)

## 2017-08-23 LAB — THYROID STIMULATING HORMONE (TSH), REFLEX ON ABNORMAL TO FREE T4, SERUM: TSH: 3.05 u[IU]/mL (ref 0.40–4.20)

## 2017-08-23 LAB — SEDIMENTATION RATE: Sed Rate: 11 mm/hr (ref 0–20)

## 2017-08-23 MED ORDER — PIMECROLIMUS 1 % EX CREA
TOPICAL_CREAM | Freq: Two times a day (BID) | CUTANEOUS | 1 refills | Status: DC
Start: ? — End: 2017-08-23

## 2017-08-23 NOTE — Progress Notes (Signed)
Date Specimen Drawn:  08/23/2017   Time Specimen Drawn:  9:11 AM   Test(s) Ordered:  Cbc, bmp, tsh, esr, lipid   Disposition:  n/a   Patient's Tolerance:  Good   Location Specimen Drawn:  Right antecubital

## 2017-08-23 NOTE — Progress Notes (Signed)
Progress Note: 08/23/2017     Last Core Note 08/23/2017 , next Core f/u due 02/20/18             Noah Ali,   Date of birth: Sep 14, 1951, 66 y.o., male     Todays labs were drawn while fasting   "Results Review" selected to review presence/absence of prior labs                Addendums: if there any, they can be found in the Problem Summary List under "Dr. Burnadette Pop Health Maintenance/Addendums/Outside of [facility] & other Studies/Fhx/Shx/Vaccinations/Surgeries"    BP 128/82   Pulse 78   Temp 97 F (36.1 C) (Tympanic)   Resp 16   Ht 1.753 m (5\' 9" )   Wt 93.4 kg (206 lb)   SpO2 97%   BMI 30.42 kg/m        Subjective:   This 66 y.o. male presents for evaluation of the following medical condition(s):    1) Hypertension on the medications listed with good compliance.  We added norvasc 5 mg daily at his 01/01/17 visit due to elevated blood pressure.  Pt is monitoring home blood pressures with most <140/90. Most office blood pressure readings are <140/90.   BP Readings from Last 3 Encounters:   08/23/17 128/82   02/08/17 138/78   01/01/17 160/80       In review: Pt experienced acute kidney injury with peak creatinine at 1.76 during admission 1/18. ACE inhibitor and HCTZ withheld with renal function returning to normal. Discharge creatinine 1.38 with most recent creatinine are lab 3/18 coming back at 1.21    2) Hyperlipidemia on Lipitor 40 started at the time of his visit 12/14 when NIH MI risk for 10 years based on cholesterol risk calculator was 11%. He has had excellent lipid profiles consecutively since on Lipitor     3) GERD s/p EGD 5/06 on prn prilosec and under good control; averages 1-2 pills/week.  He is also taking a B12 vitamin    4) Acute nonprovoked pulmonary embolism sustained 1/18 and now on Eliquis 5 mg twice daily. 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 Other tests for  hypercoagulation came back negative including factor V Leiden.  His dose of Eliquis was dropped down to 2.5 mg twice daily when his renal function dropped, but now that he is back to normal, his Eliquis is back up to 5 mg twice daily    In review: found based on CT angiogram chest.  Duplex ultrasound of lower extremities came back normal/negative.  There still is a little residual flank area discomfort on the right    Denies shortness of breath    5) Renal cyst on the right found on CT at 12.1 by 12.1 cm size with small amount of wall calcification. Bilateral non-worrisome renal cysts were found as well measuring up to 2.3 cm in size. Patient underwent a laparoscopic right renal cystectomy earlier this month 9/18    6) Pain: LBP, currently not a problem although it does flare intermittently. Left knee pain also better at this time status post arthroscopy May 2017.  He is seeing orthopedics for this and is considering another surgery     7) Overweight and not on a diet ; we have advised low carb diet. The most he has ever weighed is 220 lbs   Wt Readings from  Last 3 Encounters:   08/23/17 93.4 kg (206 lb)   02/08/17 93.9 kg (207 lb)   01/01/17 93.4 kg (206 lb)       8) Mild aortic insufficiency and mild mitral regurge based on echocardiogram 9/17;  stable from '13 echo.  Next echo due September 2020    9) Chronic CPK elevations, mild with normal values 3 since December 2014. We are no longer checking    Denies myalgias or arthralgias. CPK last visit was normal, although mildly elevated the time before     10) Mild, intermittent anemia with neg w/u. All CBCs since 5/14 had been normal with the exception of February 2018 visit which was a post hospitalization/PE visit.  Most recent CBC 3/18 normal    11) Acute kidney injury with peak creatinine at 1.76 during admission 1/18. ACE inhibitor and HCTZ withheld with renal function returning to normal.  He was placed on Norvasc to replace these other medicines.  Discharge creatinine 1.38, and at 1.29 2/18    12) Former smoker who had a normal abdominal aorta on CT 1/18 looking for his renal mass    13) Decreased albumin/globulin ratio 1/18 admission with f/u SIEP nl/neg     14) Hypopigmented skin on both shins which started developing about a year ago and seems to be fairly stable    Hepatitis C screen 3/18 negative    Review of Systems.   Denies chest pain, shortness of breath, side effects, or past medical history of CAD, CVA, or PAD.    OBJECTIVE:   -Overweight Pt in NAD   -Neck: supple, without bruits   -Respiratory: CTA   -Cardiovascular: RRR with 1/6 SEM precordial area w/o gallops   -Abdomen: soft and nontender   -Extremities: Without edema   -Skin: Both shins with areas of vitiligo anteriorly, dorsum of feet, and around his fingernails involved as well    Assessment/Plan: Labs were drawn while fasting.   1) Hypertension with good control   -Exercise, prescription and diet are key.   F/U in 6 months    2) Hyperlipidemia; labs today     3) GERD s/p EGD 5/06 on prn prilosec and under good control; the patient is aware that PPIs increases risk for pneumonia, infectious diarrhea and osteoporosis. He is aware that he should cut back to the least dose that keeps his symptoms under control   Advise he continue B12 1 mg daily    4) Chronic CPK elevations, mild. We have stopped checking CPKs since he has had multiple normal values consecutively    5) Mild, intermittent anemia with neg w/u; repeat CBC today    6) LBP and knee pain followed by orthopedics     7) Non morbid obesity; again advise low-carb diet    8) Aortic insufficiency mild 9 /17 with next echo due 9/20    9) Large renal cyst excised and benign    10) Vitiligo  CBC, basic metabolic profile, sedimentation rate, and TSH reflex  Elidel cream to be applied sparingly daily    Patient agrees to [employer] today. Patient declines colonoscopy but does agree to [employer]. I have advised that he get a  Shingrix as well    Fall risk negligable: the patient was evaluated for risk for falls and determined not to be at risk. Timed (20 seconds) get up and go test was negative.    Follow-up 6 months    .ccmrevup  Patient declines to sign up for CCM Revup             ---------------------------------------------------------------------------------------------------------  The Progress  note for today is completed above in a SOAP note format. The patients allergies, med list, and Problem Summary List are recorded below.  ---------------------------------------------------------------------------------------------------------      For those patients followed by me, please go to the problem summary list and look under 'Overview' "Dr. Burnadette Pop Health Maintenance/Addendums/Outside of [facility] & other Studies/Fhx/Shx/Vaccinations/Surgeries" which was reviewed and updated (if indicated) this date    No Known Allergies    Current Outpatient Prescriptions on File Prior to Visit   Medication Sig Dispense Refill   . amLODIPine (NORVASC) 5 MG tablet take 1 tablet by mouth once daily 90 tablet 1   . atorvastatin (LIPITOR) 40 MG tablet TAKE 1 TABLET BY MOUTH ONE TIME DAILY 90 tablet 0   . doxazosin (CARDURA) 8 MG tablet take 1 tablet by mouth at bedtime 90 tablet 0   . ELIQUIS 5 MG TAKE 2 TABLETS BY MOUTH TWICE A DAY FOR 7 DAYS, THEN TAKE 1 TWICE A DAY FOR 21 DAYS  0   . omeprazole (PRILOSEC) 20 MG capsule TAKE 1 CAPSULE BY MOUTH DAILY AS NEEDED 90 capsule 1     No current facility-administered medications on file prior to visit.        Patient Active Problem List   Diagnosis   . Pure hypercholesterolemia   . CPK elevation, chronic   . Esophageal reflux s/p EGD 5/06   . Headache(784.0)   . HTN   . Lumbago   . Anemia, neg w/u in past   . Dr. Burnadette Pop Health Maintenance/Addendums/Outside of [facility] & other Studies/Fhx/Shx/Vaccinations/Surgeries   . Non morbid obesity   . Aortic insufficiency-4/13 echo   .  Pulmonary embolism 1/18   . Peripheral arterial disease       RESULTRCNT(52w)    This note was completed using Engineer, production.  Grammatical errors, random word insertions, pronoun errors, incomplete sentences, and other errors are possible due to software limitations.  It is possible that I may have missed such errors when carefully reviewing the note. If the reader has questions or concerns regarding these errors or this note, please address them with me for clarification.     This note was electronically signed by Darlina Sicilian, MD

## 2017-09-03 ENCOUNTER — Telehealth (RURAL_HEALTH_CENTER): Payer: Self-pay

## 2017-09-03 NOTE — Telephone Encounter (Signed)
Called stating that when went to pick up medication pimecrolimus ( elidel) 1% cream the pharmacy told them insurance would not cover and to call the doctor to have him order something different.      Also code for colongaurd to get stool sample [id number]

## 2017-09-03 NOTE — Telephone Encounter (Signed)
Called stating that when went to pick up medication pimecrolimus ( elidel) 1% cream the pharmacy told them insurance would not cover and to call the doctor to have him order something different.-      ------ There is no different medication recommended for vitiligo; if they won't cover Elidel, would he like me to refer him to dermatology? Also, please clarify the code for Cologuard. I have been putting in screening for colon cancer and I'm not familiar with [id number]

## 2017-09-10 NOTE — Telephone Encounter (Signed)
Left msg pt to call for derm referral   sf

## 2017-09-13 ENCOUNTER — Telehealth (RURAL_HEALTH_CENTER): Payer: Self-pay | Admitting: Family Medicine

## 2017-09-13 ENCOUNTER — Other Ambulatory Visit (RURAL_HEALTH_CENTER): Payer: Self-pay | Admitting: Family Medicine

## 2017-09-13 DIAGNOSIS — L80 Vitiligo: Secondary | ICD-10-CM

## 2017-09-13 NOTE — Telephone Encounter (Signed)
Dermatology referral made-jwt

## 2017-09-13 NOTE — Telephone Encounter (Signed)
PT REQUESTS REFERRAL TO DERMATOLOGY, [place].

## 2017-09-14 ENCOUNTER — Other Ambulatory Visit (RURAL_HEALTH_CENTER): Payer: Self-pay | Admitting: Family Medicine

## 2017-10-16 ENCOUNTER — Other Ambulatory Visit (RURAL_HEALTH_CENTER): Payer: Self-pay | Admitting: Family Medicine

## 2017-10-18 ENCOUNTER — Other Ambulatory Visit (RURAL_HEALTH_CENTER): Payer: Self-pay | Admitting: Family Medicine

## 2017-11-01 ENCOUNTER — Telehealth (RURAL_HEALTH_CENTER): Payer: Self-pay | Admitting: Family Medicine

## 2017-11-01 NOTE — Telephone Encounter (Signed)
Cologuard negative---jwt

## 2017-11-04 NOTE — Telephone Encounter (Signed)
The patient has been notified of this information and all questions answered.

## 2017-12-11 ENCOUNTER — Other Ambulatory Visit (RURAL_HEALTH_CENTER): Payer: Self-pay | Admitting: Family Medicine

## 2018-02-21 ENCOUNTER — Ambulatory Visit (RURAL_HEALTH_CENTER): Payer: Medicare Other | Admitting: Family Medicine

## 2018-03-17 ENCOUNTER — Other Ambulatory Visit (RURAL_HEALTH_CENTER): Payer: Self-pay | Admitting: Family Medicine

## 2018-04-15 ENCOUNTER — Other Ambulatory Visit (RURAL_HEALTH_CENTER): Payer: Self-pay | Admitting: Family Medicine

## 2018-04-22 ENCOUNTER — Other Ambulatory Visit (RURAL_HEALTH_CENTER): Payer: Self-pay

## 2018-04-22 MED ORDER — AMLODIPINE BESYLATE 5 MG PO TABS
5.0000 mg | ORAL_TABLET | Freq: Every day | ORAL | 0 refills | Status: DC
Start: ? — End: 2018-04-22

## 2018-04-22 NOTE — Telephone Encounter (Signed)
Pt has app in July,  soonest he could get

## 2018-04-23 ENCOUNTER — Other Ambulatory Visit (RURAL_HEALTH_CENTER): Payer: Self-pay | Admitting: Family Medicine

## 2018-05-05 ENCOUNTER — Other Ambulatory Visit (RURAL_HEALTH_CENTER): Payer: Self-pay | Admitting: Family Medicine

## 2018-05-05 MED ORDER — ATORVASTATIN CALCIUM 40 MG PO TABS
40.0000 mg | ORAL_TABLET | Freq: Every day | ORAL | 1 refills | Status: DC
Start: ? — End: 2018-05-05

## 2018-05-11 ENCOUNTER — Other Ambulatory Visit (RURAL_HEALTH_CENTER): Payer: Self-pay | Admitting: Family Medicine

## 2018-05-15 ENCOUNTER — Other Ambulatory Visit (RURAL_HEALTH_CENTER): Payer: Self-pay

## 2018-05-15 MED ORDER — DOXAZOSIN MESYLATE 8 MG PO TABS
8.0000 mg | ORAL_TABLET | Freq: Every evening | ORAL | 0 refills | Status: DC
Start: ? — End: 2018-05-15

## 2018-05-30 ENCOUNTER — Other Ambulatory Visit (RURAL_HEALTH_CENTER): Payer: Self-pay | Admitting: Family Medicine

## 2018-06-03 ENCOUNTER — Other Ambulatory Visit (RURAL_HEALTH_CENTER): Payer: Self-pay

## 2018-06-03 MED ORDER — AMLODIPINE BESYLATE 5 MG PO TABS
5.0000 mg | ORAL_TABLET | Freq: Every day | ORAL | 0 refills | Status: DC
Start: ? — End: 2018-06-03

## 2018-06-13 ENCOUNTER — Ambulatory Visit: Payer: Medicare Other | Attending: Family Medicine | Admitting: Family Medicine

## 2018-06-13 ENCOUNTER — Encounter (RURAL_HEALTH_CENTER): Payer: Self-pay | Admitting: Family Medicine

## 2018-06-13 ENCOUNTER — Other Ambulatory Visit
Admission: RE | Admit: 2018-06-13 | Discharge: 2018-06-13 | Disposition: A | Discharge status: Home / Self Care | Payer: Medicare Other | Source: Ambulatory Visit | Attending: Family Medicine | Admitting: Family Medicine

## 2018-06-13 VITALS — BP 120/70 | HR 70 | Temp 96.9°F | Resp 16 | Ht 69.0 in | Wt 217.0 lb

## 2018-06-13 DIAGNOSIS — I2699 Other pulmonary embolism without acute cor pulmonale: Secondary | ICD-10-CM

## 2018-06-13 DIAGNOSIS — K219 Gastro-esophageal reflux disease without esophagitis: Secondary | ICD-10-CM

## 2018-06-13 DIAGNOSIS — I10 Essential (primary) hypertension: Secondary | ICD-10-CM

## 2018-06-13 DIAGNOSIS — Z23 Encounter for immunization: Secondary | ICD-10-CM

## 2018-06-13 DIAGNOSIS — E669 Obesity, unspecified: Secondary | ICD-10-CM

## 2018-06-13 DIAGNOSIS — I351 Nonrheumatic aortic (valve) insufficiency: Secondary | ICD-10-CM

## 2018-06-13 DIAGNOSIS — I739 Peripheral vascular disease, unspecified: Secondary | ICD-10-CM

## 2018-06-13 DIAGNOSIS — E7800 Pure hypercholesterolemia, unspecified: Secondary | ICD-10-CM

## 2018-06-13 LAB — CBC AND DIFFERENTIAL
Basophils %: 0.3 % (ref 0.0–3.0)
Basophils Absolute: 0 10*3/uL (ref 0.0–0.3)
Eosinophils %: 4.9 % (ref 0.0–7.0)
Eosinophils Absolute: 0.3 10*3/uL (ref 0.0–0.8)
Hematocrit: 41.4 % (ref 39.0–52.5)
Hemoglobin: 13.7 gm/dL (ref 13.0–17.5)
Lymphocytes Absolute: 1.1 10*3/uL (ref 0.6–5.1)
Lymphocytes: 20 % (ref 15.0–46.0)
MCH: 29 pg (ref 28–35)
MCHC: 33 gm/dL (ref 32–36)
MCV: 89 fL (ref 80–100)
MPV: 10.1 fL — ABNORMAL HIGH (ref 6.0–10.0)
Monocytes Absolute: 0.6 10*3/uL (ref 0.1–1.7)
Monocytes: 11.2 % (ref 3.0–15.0)
Neutrophils %: 63.6 % (ref 42.0–78.0)
Neutrophils Absolute: 3.5 10*3/uL (ref 1.7–8.6)
PLT CT: 170 10*3/uL (ref 130–440)
RBC: 4.66 10*6/uL (ref 4.00–5.70)
RDW: 13.2 % (ref 11.0–14.0)
WBC: 5.4 10*3/uL (ref 4.0–11.0)

## 2018-06-13 LAB — LIPD PANEL WITH NON-HDL CHOLESTEROL, SERUM
Cholesterol: 121 mg/dL (ref 75–199)
Coronary Heart Disease Risk: 2.28
HDL: 53 mg/dL (ref 40–55)
LDL Calculated: 61 mg/dL
NON HDL CHOLESTEROL: 68 mg/dL
Triglycerides: 36 mg/dL (ref 10–150)
VLDL: 7 (ref 0–40)

## 2018-06-13 LAB — BASIC METABOLIC PANEL
Anion Gap: 13.1 mMol/L (ref 7.0–18.0)
BUN / Creatinine Ratio: 13.7 Ratio (ref 10.0–30.0)
BUN: 16 mg/dL (ref 7–22)
CO2: 22 mMol/L (ref 20–30)
Calcium: 9.4 mg/dL (ref 8.5–10.5)
Chloride: 109 mMol/L (ref 98–110)
Creatinine: 1.17 mg/dL (ref 0.80–1.30)
EGFR: 64 mL/min/{1.73_m2} (ref 60–150)
Glucose: 89 mg/dL (ref 71–99)
Osmolality Calc: 280 mOsm/kg (ref 275–300)
Potassium: 4.1 mMol/L (ref 3.5–5.3)
Sodium: 140 mMol/L (ref 136–147)

## 2018-06-13 MED ORDER — ATORVASTATIN CALCIUM 40 MG PO TABS
40.0000 mg | ORAL_TABLET | Freq: Every day | ORAL | 1 refills | Status: DC
Start: ? — End: 2018-06-13

## 2018-06-13 MED ORDER — ELIQUIS 5 MG PO TABS
ORAL_TABLET | ORAL | 1 refills | Status: DC
Start: ? — End: 2018-06-13

## 2018-06-13 MED ORDER — AMLODIPINE BESYLATE 5 MG PO TABS
5.0000 mg | ORAL_TABLET | Freq: Every day | ORAL | 1 refills | Status: DC
Start: ? — End: 2018-06-13

## 2018-06-13 MED ORDER — DOXAZOSIN MESYLATE 8 MG PO TABS
8.0000 mg | ORAL_TABLET | Freq: Every evening | ORAL | 1 refills | Status: DC
Start: ? — End: 2018-06-13

## 2018-06-13 MED ORDER — OMEPRAZOLE 20 MG PO CPDR
DELAYED_RELEASE_CAPSULE | ORAL | 0 refills | Status: DC
Start: ? — End: 2018-06-13

## 2018-06-13 NOTE — Progress Notes (Signed)
Progress Note: 06/13/2018     Last Core Note 06/13/2018 , next Core f/u due 12/14/18             Noah Ali,   Date of birth: December 16, 1950, 67 y.o., male     Todays labs were drawn while fasting, goal non HDL < 130 and is on a statin   "Results Review" selected to review presence/absence of prior labs                Addendums: if there any, they can be found in the Problem Summary List under "Dr. Burnadette Pop Health Maintenance/Addendums/Outside of [facility] & other Studies/Fhx/Shx/Vaccinations/Surgeries"    BP 120/70   Pulse 70   Temp (!) 96.9 F (36.1 C) (Tympanic)   Resp 16   Ht 1.753 m (5\' 9" )   Wt 98.4 kg (217 lb)   SpO2 96%   BMI 32.05 kg/m        Chief Complaint: Pt here for Chronic medical problems followup:    Subjective:   This 67 y.o. male presents for evaluation of the following medical condition(s):    1) Hypertension on the medications listed with good compliance.Pt is monitoring home blood pressures with most <140/90. Most office blood pressure readings are <140/90.   BP Readings from Last 3 Encounters:   06/13/18 120/70   08/23/17 128/82   02/08/17 138/78       In review: Pt experienced acute kidney injury with peak creatinine at 1.76 during admission 1/18. ACE inhibitor and HCTZ withheld with renal function returning to normal. Discharge creatinine 1.38 with most recent creatinine 1.28 9/18    2) Hyperlipidemia on Lipitor 40 mg with risk of MI/CVAfor 10 years based on cholesterol risk calculator was 11%. He has had excellentlipid profiles consecutively since on Lipitor     3) GERD s/p EGD 5/06 on prn prilosec and under good control; averages 3 pills/week.  He is also taking a B12 vitamin    4) Acute nonprovoked pulmonary embolism sustained 1/18 and now on Eliquis 5 mg twice daily.     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 Other tests for hypercoagulation  came back negative including factor V Leiden.  His dose of Eliquis was dropped down to 2.5 mg twice daily when his renal function dropped, but now that he is back to normal, his Eliquis is back up to 5 mg twice daily.  PE found based on CT angiogram chest. Duplex ultrasound of lower extremities came back normal/negative. There still is a little residual flank area discomfort on the right    Denies shortness of breath    5) Renal cyst on the right found on CT at 12.1 by 12.1 cm size with small amount of wall calcification. Bilateral non-worrisome renal cysts were found as well measuring up to 2.3 cm in size. Patient underwent a laparoscopic right renal cystectomy with benign path found    6) Pain: LBP, currently not a problem although it does flare intermittently. Left knee pain status post arthroscopy and doing better, but now the right knee is bothering him and he is thinking he may have to go and get that done as well    7) Overweight and not on a diet ; we have advised low carb diet. The most he has ever weighed is 220 lbs   Wt Readings from Last 3 Encounters:  06/13/18 98.4 kg (217 lb)   08/23/17 93.4 kg (206 lb)   02/08/17 93.9 kg (207 lb)         8) Mild aortic insufficiency and mild mitral regurge based on echocardiogram 9/17; stable from '13 echo. Next echo due September 2020    9) Chronic CPK elevations, mild with normal values 3 since December 2014. We are no longer checking CPK's    Denies myalgias or arthralgias. CPK last visit was normal, although mildly elevated the time before     10) Mild, intermittent anemia with neg w/u. All CBCs since 5/14 had been normal with the exception of February 2018 visit which was a post hospitalization/PE visit.  Most recent CBC 3/18 and 9/18 normal    11) Acute kidney injury with peak creatinine at 1.76 during admission 1/18. ACE inhibitor and HCTZ withheld with renal function returning to normal. He was placed on Norvasc and subsequently Cardura to  replace these other medicines which he remains on    12) Former smoker who had a normal abdominal aorta on CT 1/18 of his abdomen.  He quit smoking around age 61    15) Decreased albumin/globulin ratio 1/18 admission with f/u SIEP nl/neg     14) Vitiligo legs characterized by hypopigmented skin on both shins which started developing about a year ago and seems to be fairly stable.  He is followed by dermatology and is on tacrolimus    Hepatitis C screen 3/18 negative, and Cologuard negative 12/18      Review of Systems.   Denies chest pain, shortness of breath, side effects, or past medical history of CAD, CVA, or PAD.    OBJECTIVE:   -Overweight Pt in NAD   -Neck: supple, without bruits   -Respiratory: CTA   -Cardiovascular: RRR with 1/6 SEM precordial area w/o gallops   -Abdomen: soft and nontender   -Extremities: Without edema     Assessment/Plan: Labs were drawn while fasting.   1) Hypertension with good control   -Exercise, prescription and diet are key.   F/U in 6 months    2) Hyperlipidemia; labs today     3) GERD s/p EGD 5/06 on prn prilosec and under good control   Advise he continue B12 1 mg daily    4) Chronic CPK elevations, mild. We have stoppedchecking CPKs since he has had multiple normal values consecutively    5) Mild, intermittent anemia with neg w/u; repeat CBC today    6) LBP and knee pain followed by orthopedics     7) Non morbid obesity; again advise low-carb diet    8) Aortic insufficiency mild 9 /17 with next echo due 9/20    9) Large renal cyst excised and benign    10) Vitiligo per dermatology    Patient agrees to Pneumovax today.     Fall risk negligable: the patient was evaluated for risk for falls and determined not to be at risk. Timed (20 seconds) get up and go test was negative.    Follow-up 6 months    .ccmrevup  Patient declines to sign up for CCM Revup    Pt declines to schedule annual Medicare Wellness Visit with Leontine Locket     Follow-up 6  montsh             ---------------------------------------------------------------------------------------------------------   The Progress  note for today is completed above in a SOAP note format. The patients allergies, med list, and Problem Summary List are recorded below.  ---------------------------------------------------------------------------------------------------------  For those patients followed by me, please go to the problem summary list and look under 'Overview' "Dr. Burnadette Pop Health Maintenance/Addendums/Outside of [facility] & other Studies/Fhx/Shx/Vaccinations/Surgeries" which was reviewed and updated (if indicated) this date    No Known Allergies    Current Outpatient Prescriptions on File Prior to Visit   Medication Sig Dispense Refill   . amLODIPine (NORVASC) 5 MG tablet Take 1 tablet (5 mg total) by mouth daily 30 tablet 0   . atorvastatin (LIPITOR) 40 MG tablet Take 1 tablet (40 mg total) by mouth daily 30 tablet 1   . doxazosin (CARDURA) 8 MG tablet take 1 tablet by mouth at bedtime 15 tablet 0   . ELIQUIS 5 MG TAKE 2 TABLETS BY MOUTH TWICE A DAY FOR 7 DAYS, THEN TAKE 1 TWICE A DAY FOR 21 DAYS  0   . omeprazole (PRILOSEC) 20 MG capsule take 1 capsule by mouth once daily if needed 90 capsule 0   . pimecrolimus (ELIDEL) 1 % cream Apply topically 2 (two) times daily. 60 g 1   . [DISCONTINUED] atorvastatin (LIPITOR) 40 MG tablet take 1 tablet by mouth once daily 30 tablet 0   . [DISCONTINUED] doxazosin (CARDURA) 8 MG tablet Take 1 tablet (8 mg total) by mouth nightly 30 tablet 0     No current facility-administered medications on file prior to visit.        Patient Active Problem List   Diagnosis   . Pure hypercholesterolemia   . CPK elevation, chronic   . Esophageal reflux s/p EGD 5/06   . Headache(784.0)   . HTN   . Lumbago   . Anemia, neg w/u in past   . Dr. Burnadette Pop Health Maintenance/Addendums/Outside of [facility] & other Studies/Fhx/Shx/Vaccinations/Surgeries   . Non  morbid obesity   . Aortic insufficiency-4/13 echo   . Pulmonary embolism 1/18   . Peripheral arterial disease   . Vitiligo       RESULTRCNT(52w)    This note was completed using Engineer, production.  Grammatical errors, random word insertions, pronoun errors, incomplete sentences, and other errors are possible due to software limitations.  It is possible that I may have missed such errors when carefully reviewing the note. If the reader has questions or concerns regarding these errors or this note, please address them with me for clarification.     This note was electronically signed by Darlina Sicilian, MD

## 2018-06-13 NOTE — Progress Notes (Signed)
Date Specimen Drawn:  06/13/2018   Time Specimen Drawn:  9:47 AM   Test(s) Ordered:  cbcm bmp, lipid   Disposition:  n/a   Patient's Tolerance:  Good   Location Specimen Drawn:  Right antecubital

## 2018-06-15 NOTE — Progress Notes (Signed)
Please ask pt to sign up for MyChart if not already done when you call them with the below lab results.  Give them instructions on how to do so.   If they sign up, I will start sending results directly to them rather than through you----jwt    Please call pt with the following lab results and instructions:    CBC: normal/negative test  Kidney function test: normal/negative test  Cholesterol/Lipid profile: normal; target cholesterol level attained; please continue your cholesterol medication(s)   Blood sugar: Normal; no evidence of diabetes, prediabetes, or low blood sugar    Please do not discontinue or change any of your medications unless instructed to do so. Keep your next scheduled visit.    Ahmaad Neidhardt W Jamylah Marinaccio, MD

## 2018-07-04 ENCOUNTER — Encounter (INDEPENDENT_AMBULATORY_CARE_PROVIDER_SITE_OTHER): Payer: Self-pay

## 2018-07-10 ENCOUNTER — Ambulatory Visit: Payer: Medicare Other | Attending: Family Medicine | Admitting: Family Medicine

## 2018-07-10 ENCOUNTER — Encounter (RURAL_HEALTH_CENTER): Payer: Self-pay | Admitting: Family Medicine

## 2018-07-10 VITALS — BP 128/68 | HR 72 | Temp 98.1°F | Resp 16 | Ht 69.0 in | Wt 216.0 lb

## 2018-07-10 DIAGNOSIS — M5459 Other low back pain: Secondary | ICD-10-CM

## 2018-07-10 DIAGNOSIS — M545 Low back pain: Secondary | ICD-10-CM

## 2018-07-10 NOTE — Progress Notes (Addendum)
Progress Note: 07/10/2018    Last Core Note 06/13/2018 , next Core f/u due 12/14/18          Noah Ali,   Date of birth: March 07, 1951, 67 y.o., male         "Results Review" selected to review presence/absence of prior labs                Addendums: if there any, they can be found in the Problem Summary List under "Dr. Burnadette Pop Health Maintenance/Addendums/Outside of [facility] & other Studies/Fhx/Shx/Vaccinations/Surgeries"    BP 128/68   Pulse 72   Temp 98.1 F (36.7 C) (Tympanic)   Resp 16   Ht 1.753 m (5\' 9" )   Wt 98 kg (216 lb)   SpO2 97%   BMI 31.90 kg/m        Chief Complaint: Pt here for Acute issue; Low back pain    Subjective:   This 67 y.o. male presents for evaluation of the following medical condition(s):    1) Bilateral low back pain this been present for about the last 10 days.  There is a remote history of low back pain but that was years ago.  He is concerned in that he had a large cyst removed from his kidney which was associated with pain that was benign and he was wondering if it could be coming back.    Nothing makes the pain better or worse.  He has been taking Tylenol.  Severity 3/10    Denies known injury, urinary symptoms, radiation to the legs, bowel or bladder function    Review of Systems/Elements of the HPI:  Per subjective above.      Objective: (jwtprocedure--abscess, ingrown toenail, cerumen, joint injection)  WDWN pt in NAD  -Head: NC/AT  -Neck: Supple.     -Lungs: CTA  -CV: Regular rate and rhythm with 1-2/6 systolic ejection murmur aortic area  -Back: Without deformity and nontender  -Waist: Full range of motion  -Straight leg raise plus Lasegue's negative  -Patellar reflexes 2+ bilaterally, Achilles reflexes 0 bilaterally    Assessment/Plan:(.jwtapprocedure)  1) Bilateral low back pain w/o sciatica  Tylenol around the clock (appropriate dosing discussed and not within 8 hours of other tylenol containing products)  SAM-E  Ice treatments (consider gel pack in pouch,  apply up to 20 minutes TID)   Home    Health Maintenance:    Advised flu shot this fall    .JWTHelpfulCodes (physical exams, time in room, procedure [modifier 25 dx code joint based on size, skin bx, skin tags, ingrown toenail, abscess I&D, cerumen disimpaction, vasectomy])                  ---------------------------------------------------------------------------------------------------------   The Progress  note for today is completed above in a SOAP note format. The patients allergies, med list, and Problem Summary List are recorded below.  ---------------------------------------------------------------------------------------------------------      For those patients followed by me, please go to the problem summary list and look under 'Overview' "Dr. Burnadette Pop Health Maintenance/Addendums/Outside of [facility] & other Studies/Fhx/Shx/Vaccinations/Surgeries" which was reviewed and updated (if indicated) this date    No Known Allergies    Current Outpatient Prescriptions on File Prior to Visit   Medication Sig Dispense Refill   . amLODIPine (NORVASC) 5 MG tablet Take 1 tablet (5 mg total) by mouth daily 90 tablet 1   . atorvastatin (LIPITOR) 40 MG tablet Take 1 tablet (40 mg total) by mouth daily 90 tablet  1   . doxazosin (CARDURA) 8 MG tablet Take 1 tablet (8 mg total) by mouth nightly 90 tablet 1   . ELIQUIS 5 MG TAKE 2 TABLETS BY MOUTH TWICE A DAY FOR 7 DAYS, THEN TAKE 1 TWICE A DAY FOR 21 DAYS 180 tablet 1   . omeprazole (PRILOSEC) 20 MG capsule take 1 capsule by mouth once daily if needed 90 capsule 0   . pimecrolimus (ELIDEL) 1 % cream Apply topically 2 (two) times daily. 60 g 1     No current facility-administered medications on file prior to visit.        Patient Active Problem List   Diagnosis   . Pure hypercholesterolemia   . CPK elevation, chronic   . Esophageal reflux s/p EGD 5/06   . Headache(784.0)   . HTN   . Lumbago   . Anemia, neg w/u in past   . Dr. Burnadette Pop Health  Maintenance/Addendums/Outside of [facility] & other Studies/Fhx/Shx/Vaccinations/Surgeries   . Non morbid obesity   . Aortic insufficiency-4/13 echo   . Pulmonary embolism 1/18   . Peripheral arterial disease   . Vitiligo       RESULTRCNT(52w)    This note was completed using Engineer, production.  Grammatical errors, random word insertions, pronoun errors, incomplete sentences, and other errors are possible due to software limitations.  It is possible that I may have missed such errors when carefully reviewing the note. If the reader has questions or concerns regarding these errors or this note, please address them with me for clarification.     This note was electronically signed by Darlina Sicilian, MD

## 2018-08-04 ENCOUNTER — Encounter (INDEPENDENT_AMBULATORY_CARE_PROVIDER_SITE_OTHER): Payer: Self-pay

## 2018-09-16 ENCOUNTER — Encounter (INDEPENDENT_AMBULATORY_CARE_PROVIDER_SITE_OTHER): Payer: Self-pay

## 2018-10-08 ENCOUNTER — Encounter (INDEPENDENT_AMBULATORY_CARE_PROVIDER_SITE_OTHER): Payer: Self-pay

## 2018-10-15 ENCOUNTER — Encounter (INDEPENDENT_AMBULATORY_CARE_PROVIDER_SITE_OTHER): Payer: Self-pay

## 2018-11-03 ENCOUNTER — Encounter (INDEPENDENT_AMBULATORY_CARE_PROVIDER_SITE_OTHER): Payer: Self-pay

## 2018-11-03 ENCOUNTER — Other Ambulatory Visit (RURAL_HEALTH_CENTER): Payer: Self-pay | Admitting: Family Medicine

## 2018-11-12 ENCOUNTER — Encounter (INDEPENDENT_AMBULATORY_CARE_PROVIDER_SITE_OTHER): Payer: Self-pay

## 2018-11-14 ENCOUNTER — Emergency Department
Admission: EM | Admit: 2018-11-14 | Discharge: 2018-11-14 | Disposition: A | Discharge status: Home / Self Care | Payer: Medicare Other | Attending: Family Medicine | Admitting: Family Medicine

## 2018-11-14 ENCOUNTER — Emergency Department: Payer: Medicare Other

## 2018-11-14 DIAGNOSIS — M7022 Olecranon bursitis, left elbow: Secondary | ICD-10-CM | POA: Insufficient documentation

## 2018-11-14 NOTE — Discharge Instructions (Signed)
Bursitis of the Elbow (Olecranon)  Your elbow joint contains a small fluid-filled sac called a bursa. The bursa helps the muscles and tendons move smoothly over the bone. It also cushions and protects your elbow. Bursitis is when the bursa is inflamed or swollen. This is most often due to overuse of or injury to the elbow. Symptoms include swelling and pain. If the elbow is red and feels warm to the touch, the bursa itself may be infected.  In most cases, elbow bursitis resolves with medicine and self-care at home. It may take several weeks for the bursa to heal and the swelling to go away. In some cases, your healthcare provider may drain excess fluid from the bursa. Or, he or she may inject medicine directly into the bursa to help relieve symptoms. In severe cases, you may need surgery to remove the bursa may. If there is concern that the bursa is infected, your healthcare provider may prescribe antibiotics to treat the infection.    Home care  Your healthcare provider may prescribe medicine to help relieve pain and swelling. This may be an over-the-counter pain reliever or prescription pain medicine. Take all medicines as directed. To help treat or prevent infection, your provider may prescribe antibiotics. If these are prescribed, take them as directed until they are gone.  The following are general care guidelines:   Apply an ice pack or bag of frozen peas wrapped in a thin towel to your elbow for 15 to 20 minutes at a time. Do this 3 to 4 times a day until pain and swelling improve.   Keep your elbow raised above the level of your heart whenever possible. This helps reduce swelling. When sitting or lying down, place your arm on a pillow that rests on your chest or on a pillow at your side.   Use an elastic wrap around the elbow joint to compress the area while it is healing. Make the wrap snug but not tight to the point of causing pain.   Rest your elbow to give it time to heal. You may need to wear an  elbow pad to help protect and limit the movement of your elbow. During and after healing, avoid leaning on your elbows.  Follow-up care  Follow up with your healthcare provider, or as advised. If you have been referred to a specialist, make that appointment promptly.  When to seek medical advice  Call your healthcare provider right away if any of these occur:   Fever of 100.4F (38C) or higher, or as advised   Chills   Increased pain, swelling, warmth, redness, or drainage from the joint   Trouble moving the elbow joint   Numbness or tingling in the hand   Severe pain or swelling in forearm or hand   Loss of pink color and slow return of color after squeezing fingertip or hand  [employer] last reviewed this educational content on 04/27/2015   2000-2019 [employer] [address], [place] [address]. All rights reserved. This information is not intended as a substitute for professional medical care. Always follow your healthcare professional's instructions.

## 2018-11-14 NOTE — ED Provider Notes (Signed)
Physician/Midlevel provider first contact with patient: 11/14/18 1801         History     Chief Complaint   Patient presents with   . Bursitis     HPI     Very pleasant gentleman presents after noticing a bump on his left elbow. It is not painful. It does not interfere with ROM or function. 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 No other complaint. He is followed by [facility] for knee problems.    Past Medical History:   Diagnosis Date   . Anemia    . Gastroesophageal reflux disease    . Hypertension    . PE (pulmonary thromboembolism)        Past Surgical History:   Procedure Laterality Date   . CHOLECYSTECTOMY     . KNEE ARTHROSCOPY         History reviewed. No pertinent family history.    Social  Social History     Tobacco Use   . Smoking status: Former Smoker     Last attempt to quit: 11/11/1983     Years since quitting: 35.0   . Smokeless tobacco: Former Neurosurgeon     Types: Chew     Quit date: 11/11/1983   Substance Use Topics   . Alcohol use: No   . Drug use: No       .     No Known Allergies    Home Medications             amLODIPine (NORVASC) 5 MG tablet     Take 1 tablet (5 mg total) by mouth daily     atorvastatin (LIPITOR) 40 MG tablet     Take 1 tablet (40 mg total) by mouth daily     doxazosin (CARDURA) 8 MG tablet     Take 1 tablet (8 mg total) by mouth nightly     ELIQUIS 5 MG     TAKE 2 TABLETS BY MOUTH TWICE A DAY FOR 7 DAYS, THEN TAKE 1 TWICE A DAY FOR 21 DAYS     omeprazole (PRILOSEC) 20 MG capsule     TAKE 1 CAPSULE BY MOUTH ONCE DAILY IF NEEDED           Review of Systems   Constitutional: Negative for chills and fever.   HENT: Negative for rhinorrhea and sore throat.    Respiratory: Negative for shortness of breath and wheezing.    Cardiovascular: Negative for chest pain and palpitations.   Gastrointestinal: Negative for constipation, diarrhea, nausea and vomiting.   Skin: Negative for rash.   Neurological: Negative for  light-headedness and headaches.   Psychiatric/Behavioral: Negative for behavioral problems. The patient is not nervous/anxious.        Physical Exam    BP: 174/77, Heart Rate: 80, Temp: 97.9 F (36.6 C), Resp Rate: 18, SpO2: 98 %, Weight: 106 kg    Physical Exam  Vitals signs and nursing note reviewed.   Constitutional:       General: He is not in acute distress.     Appearance: Normal appearance. He is normal weight.   HENT:      Head: Normocephalic and atraumatic.      Nose: Nose normal.      Mouth/Throat:      Mouth: Mucous membranes are moist.      Pharynx: Oropharynx is clear.   Eyes:      General: No scleral icterus.  Neck:      Musculoskeletal: Normal range of motion and neck supple.   Cardiovascular:      Rate and Rhythm: Normal rate and regular rhythm.      Pulses: Normal pulses.   Pulmonary:      Effort: Pulmonary effort is normal.      Breath sounds: Normal breath sounds.   Abdominal:      General: Abdomen is flat. Bowel sounds are normal. There is no distension.   Musculoskeletal:      Comments: Right elbow with non-tender olecranon bursa without warm or redness. FROM. Neurovascularly intact.   Skin:     General: Skin is warm and dry.      Findings: No rash.   Neurological:      General: No focal deficit present.      Mental Status: He is alert.       Radiology Results (24 Hour)     Procedure Component Value Units Date/Time    XR Elbow Left AP Lateral And Obliques [[id number]] Collected:  11/14/18 1802    Order Status:  Completed Updated:  11/14/18 1804    Narrative:       Clinical History:  Swelling     Study Notes:   Posterior left elbow swelling.  Patient states that he noticed the swelling about 2 hours ago.  Patient denies having any pain or trouble with extension or flexion movements.  Patient denies injury as well.      Examination:  Left elbow 3 views November 14, 2018.    Comparison:  None available.    Findings:  AP, lateral, and oblique views of left elbow show isodense distention of the  olecranon bursa with overlying skin convexity. There is no internal gas or radiopaque foreign body. There is chronic triceps enthesopathy, with a moderate olecranon spur. There   is no acute fracture or retraction. Radiocapitellar and ulnar-trochlear alignment is maintained. There is no displacement of intra-articular fat pads. There are no erosions.      Impression:       Large fluid distending the left olecranon bursa, which may be related to hemorrhage or bursitis.    ReadingStation:[id number]              MDM and ED Course     ED Medication Orders (From admission, onward)    None             MDM  Number of Diagnoses or Management Options  Diagnosis management comments: I reviewed with him the risks/benefits of aspiration of his olecranon bursa. Since this is not bothering him, he does not want intervention. He can f/u with his ortho or [facility]TER prn. I did advise him that without aspiration, this may take many months to reabsorb.     Risk of Complications, Morbidity, and/or Mortality  Presenting problems: low  Diagnostic procedures: low  Management options: low                     Procedures    Clinical Impression & Disposition     Clinical Impression  Final diagnoses:   Olecranon bursitis, left elbow        ED Disposition     ED Disposition Condition Date/Time Comment    Discharge  Fri Nov 14, 2018  6:23 PM Noah Ali discharge to home/self care.    Condition at disposition: Stable           New Prescriptions    No medications on  file                 Elenor Quinones, MD  11/14/18 [phone]

## 2018-11-14 NOTE — ED Notes (Signed)
MD at bedside. 

## 2018-12-03 ENCOUNTER — Encounter (INDEPENDENT_AMBULATORY_CARE_PROVIDER_SITE_OTHER): Payer: Self-pay

## 2018-12-12 ENCOUNTER — Other Ambulatory Visit
Admission: RE | Admit: 2018-12-12 | Discharge: 2018-12-12 | Disposition: A | Discharge status: Home / Self Care | Payer: Medicare Other | Source: Ambulatory Visit | Attending: Family Medicine | Admitting: Family Medicine

## 2018-12-12 ENCOUNTER — Encounter (RURAL_HEALTH_CENTER): Payer: Self-pay | Admitting: Family Medicine

## 2018-12-12 ENCOUNTER — Ambulatory Visit: Payer: Medicare Other | Attending: Family Medicine | Admitting: Family Medicine

## 2018-12-12 VITALS — BP 130/82 | HR 75 | Temp 98.1°F | Resp 15 | Ht 69.0 in | Wt 216.0 lb

## 2018-12-12 DIAGNOSIS — E7800 Pure hypercholesterolemia, unspecified: Secondary | ICD-10-CM

## 2018-12-12 DIAGNOSIS — K219 Gastro-esophageal reflux disease without esophagitis: Secondary | ICD-10-CM

## 2018-12-12 DIAGNOSIS — I351 Nonrheumatic aortic (valve) insufficiency: Secondary | ICD-10-CM

## 2018-12-12 DIAGNOSIS — E669 Obesity, unspecified: Secondary | ICD-10-CM

## 2018-12-12 DIAGNOSIS — Z0000 Encounter for general adult medical examination without abnormal findings: Secondary | ICD-10-CM

## 2018-12-12 DIAGNOSIS — I2699 Other pulmonary embolism without acute cor pulmonale: Secondary | ICD-10-CM

## 2018-12-12 DIAGNOSIS — I739 Peripheral vascular disease, unspecified: Secondary | ICD-10-CM

## 2018-12-12 DIAGNOSIS — I10 Essential (primary) hypertension: Secondary | ICD-10-CM

## 2018-12-12 LAB — BASIC METABOLIC PANEL
Anion Gap: 14.6 mMol/L (ref 7.0–18.0)
BUN / Creatinine Ratio: 8.8 Ratio — ABNORMAL LOW (ref 10.0–30.0)
BUN: 10 mg/dL (ref 7–22)
CO2: 24 mMol/L (ref 20–30)
Calcium: 9.2 mg/dL (ref 8.5–10.5)
Chloride: 104 mMol/L (ref 98–110)
Creatinine: 1.13 mg/dL (ref 0.80–1.30)
EGFR: 66 mL/min/{1.73_m2} (ref 60–150)
Glucose: 90 mg/dL (ref 71–99)
Osmolality Calculated: 274 mOsm/kg — ABNORMAL LOW (ref 275–300)
Potassium: 4.6 mMol/L (ref 3.5–5.3)
Sodium: 138 mMol/L (ref 136–147)

## 2018-12-12 LAB — CBC AND DIFFERENTIAL
Basophils %: 0.8 % (ref 0.0–3.0)
Basophils Absolute: 0 10*3/uL (ref 0.0–0.3)
Eosinophils %: 4.5 % (ref 0.0–7.0)
Eosinophils Absolute: 0.3 10*3/uL (ref 0.0–0.8)
Hematocrit: 42.5 % (ref 39.0–52.5)
Hemoglobin: 13.8 gm/dL (ref 13.0–17.5)
Lymphocytes Absolute: 1.2 10*3/uL (ref 0.6–5.1)
Lymphocytes: 19.5 % (ref 15.0–46.0)
MCH: 28 pg (ref 28–35)
MCHC: 33 gm/dL (ref 32–36)
MCV: 86 fL (ref 80–100)
MPV: 10.1 fL — ABNORMAL HIGH (ref 6.0–10.0)
Monocytes Absolute: 0.7 10*3/uL (ref 0.1–1.7)
Monocytes: 11.6 % (ref 3.0–15.0)
Neutrophils %: 63.6 % (ref 42.0–78.0)
Neutrophils Absolute: 3.8 10*3/uL (ref 1.7–8.6)
PLT CT: 180 10*3/uL (ref 130–440)
RBC: 4.93 10*6/uL (ref 4.00–5.70)
RDW: 12.8 % (ref 11.0–14.0)
WBC: 5.9 10*3/uL (ref 4.0–11.0)

## 2018-12-12 LAB — LIPD PANEL WITH NON-HDL CHOLESTEROL, SERUM
Cholesterol: 142 mg/dL (ref 75–199)
Coronary Heart Disease Risk: 2.63
HDL: 54 mg/dL (ref 40–55)
LDL Calculated: 77 mg/dL
NON HDL CHOLESTEROL: 88 mg/dL
Triglycerides: 53 mg/dL (ref 10–150)
VLDL: 11 (ref 0–40)

## 2018-12-12 NOTE — Progress Notes (Signed)
Please ask pt to sign up for MyChart if not already done when you call them with the below lab results.  Give them instructions on how to do so.   If they sign up, I will start sending results directly to them rather than through you----jwt    Please call pt with the following lab results and instructions:    CBC: normal/negative test  Kidney function test: normal/negative test  Cholesterol/Lipid profile: normal; target cholesterol level attained; please continue your cholesterol medication(s)   Blood sugar: Normal; no evidence of diabetes, prediabetes, or low blood sugar    Please do not discontinue or change any of your medications unless instructed to do so. Keep your next scheduled visit.    Noah Hitchman W Angeleigh Chiasson, MD

## 2018-12-12 NOTE — Progress Notes (Signed)
Date Specimen Drawn:  12/12/2018   Time Specimen Drawn:  9:16 AM   Test(s) Ordered:  Cbc, bmp, lipid   Disposition:  n/a   Patient's Tolerance:  Good   Location Specimen Drawn:  Right antecubital

## 2018-12-12 NOTE — Progress Notes (Signed)
Progress Note: 12/12/2018     Last Core Note 12/12/2018, next Core f/u due 06/12/19.                Noah Ali,   Date of birth: 1951-01-02, 68 y.o., male     Todays labs were drawn while nonfasting, goal non HDL < 130 and is on a statin   "Results Review" selected to review presence/absence of prior labs                Addendums: if there any, they can be found in the Problem Summary List under "Dr. Burnadette Pop Health Maintenance/Addendums/Outside of [facility] & other Studies/Fhx/Shx/Vaccinations/Surgeries"    BP 130/82   Pulse 75   Temp 98.1 F (36.7 C)   Resp 15   Ht 1.753 m (5\' 9" )   Wt 98 kg (216 lb)   SpO2 97%   BMI 31.90 kg/m        Chief Complaint: Pt here for Chronic medical problems followup:    Subjective:   This 68 y.o. male presents for evaluation of the following medical condition(s):    1) Hypertension on the medications listed with good compliance.Pt is monitoring home blood pressures with most <140/90. Most office blood pressure readings are <140/90.   BP Readings from Last 3 Encounters:   12/12/18 130/82   11/14/18 174/77   07/10/18 128/68       In review: Pt experienced acute kidney injury with peak creatinine at 1.76 during admission 1/18. ACE inhibitor and HCTZ withheld with renal function returning to normal. Discharge creatinine 1.38 with most recent creatinine 1.17 7/19    2) Hyperlipidemia on Lipitor 40 mg with risk of MI/CVAfor 10 years based on cholesterol risk calculator was 11%. He has had excellentlipid profiles consecutively since on Lipitor     3) GERD s/p EGD 5/06 on prn prilosec and under good control; averages 2-3 pills/week. He is also taking a B12 vitamin    4) Acute nonprovoked pulmonary embolism sustained 1/18 and now on Eliquis 5 mg twice daily.     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 Other tests for hypercoagulation came  back negative including factor V Leiden. His dose of Eliquis was dropped down to 2.5 mg twice daily when his renal function dropped, but now that he is back to normal, his Eliquis is back up to 5 mg twice daily.  PE found based on CT angiogram chest. Duplex ultrasound of lower extremities came back normal/negative. There still is a little residual flank area discomfort on the right    Denies shortness of breath    5) Renal cyst on the right found on CT at 12.1 by 12.1 cm size with small amount of wall calcification. Bilateral non-worrisome renal cysts were found as well measuring up to 2.3 cm in size. Patient underwent a laparoscopic right renal cystectomy with benign path found    6) Chronic pain issues: LBP that is recurrent with last flare resulting in visit 07/10/2018.  Not currently not a problem. Left knee pain status post arthroscopy and doing better, and intermittent right knee pain which responded to steroid injection and not currently a problem    7) Overweight and not on a diet ; we have advised low carb diet. The most he has ever weighed is 233 lbs   Wt Readings from Last 3 Encounters:  12/12/18 98 kg (216 lb)   11/14/18 106 kg (233 lb 11 oz)   07/10/18 98 kg (216 lb)       8) Mild aortic insufficiency and mild mitral regurge based on echocardiogram 9/17; stable from '13 echo. Next echo due September 2020    9) Chronic CPK elevations, mild with normal values 3 since December 2014. We are no longer checking CPK's    Denies myalgias or arthralgias. CPK last visit was normal, although mildly elevated the time before     10) Mild, intermittent anemia with neg w/u. All CBCs since 5/14 have been normal with the exception of February 2018 visit which was a post hospitalization/PE visit. Most recent CBC 3/18 and 9/18 normal    11) Acute kidney injury with peak creatinine at 1.76 during admission 1/18. ACE inhibitor and HCTZ withheld with renal function returning to normal. He was placed on  Norvasc and subsequently Cardura to replace these other medicines which he remains on.  Most recent creatinine 1.1 01 June 2018    12) Former smoker who quit around age 89 and had a normal abdominal aorta on CT 1/18 of his abdomen.  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  He is followed by dermatology and is on tacrolimus in the past, but currently triamcinolone    15) Left olecranon bursitis for which the patient was seen in the [facility] emergency room 11/14/2018 with normal/negative x-ray other than fluid in the bursa area.  Definitely getting better with time    Hepatitis C screen 3/18 negative, and Cologuard negative 12/18    Review of Systems.   Denies chest pain, shortness of breath, side effects, or past medical history of CAD, CVA, or PAD.    OBJECTIVE:   -Overweight Pt in NAD   -Neck: supple, without bruits   -Respiratory: CTA   -Cardiovascular: RRR with 1/6 SEM precordial area w/o gallops   -Abdomen: soft and nontender   -Extremities: Without edema     Assessment/Plan: Labs were drawn while fasting.   1) Hypertension with good control   -Exercise, prescription and diet are key.   F/U in 6 months    2) Hyperlipidemia; labs today     3) GERD s/p EGD 5/06 on prn prilosec and under good control   Advise he continue B12 1 mg daily    4) Chronic CPK elevations, mild. We have stoppedchecking CPKs since he has had multiple normal values consecutively    5) Mild, intermittent anemia with neg w/u; repeat CBC today    6) LBPand knee pain followed by orthopedicsand doing okay    7) Non morbid obesity; again advise low-carb diet-regardless excellent weight loss    8) Aortic insufficiency mild 9 /17 with next echo due 9/20    9) Large renal cyst excised and benign    10) Vitiligo per  dermatology    Patient declines flu shot today-advised Shingrix    Fall risk negligable: the patient was evaluated for risk for falls and determined not to be at risk. Timed (20 seconds) get up and go test was negative.    .ccmrevup  Patient declines to sign up for CCM Revup    Pt declines to schedule annual Medicare Wellness Visit with Leontine Locket  Follow-up 6 months               ---------------------------------------------------------------------------------------------------------   The Progress  note for today is completed above in a SOAP note format. The patients allergies, med list, and Problem Summary List are recorded below.  ---------------------------------------------------------------------------------------------------------      For those patients followed by me, please go to the problem summary list and look under 'Overview' "Dr. Burnadette Pop Health Maintenance/Addendums/Outside of [facility] & other Studies/Fhx/Shx/Vaccinations/Surgeries" which was reviewed and updated (if indicated) this date    No Known Allergies    Current Outpatient Medications on File Prior to Visit   Medication Sig Dispense Refill   . amLODIPine (NORVASC) 5 MG tablet Take 1 tablet (5 mg total) by mouth daily 90 tablet 1   . atorvastatin (LIPITOR) 40 MG tablet Take 1 tablet (40 mg total) by mouth daily 90 tablet 1   . cyanocobalamin 1000 MCG tablet Take 1,000 mcg by mouth daily     . doxazosin (CARDURA) 8 MG tablet Take 1 tablet (8 mg total) by mouth nightly 90 tablet 1   . ELIQUIS 5 MG TAKE 2 TABLETS BY MOUTH TWICE A DAY FOR 7 DAYS, THEN TAKE 1 TWICE A DAY FOR 21 DAYS 180 tablet 1   . omeprazole (PRILOSEC) 20 MG capsule TAKE 1 CAPSULE BY MOUTH ONCE DAILY IF NEEDED 90 capsule 0   . triamcinolone (KENALOG) 0.1 % cream Apply topically 2 (two) times daily       No current facility-administered medications on file prior to visit.        Patient Active Problem List   Diagnosis   . Pure hypercholesterolemia   .  CPK elevation, chronic   . Esophageal reflux s/p EGD 5/06   . Headache(784.0)   . HTN   . Lumbago   . Anemia, neg w/u in past   . Dr. Burnadette Pop Health Maintenance/Addendums/Outside of [facility] & other Studies/Fhx/Shx/Vaccinations/Surgeries   . Non morbid obesity   . Aortic insufficiency-4/13 echo   . Pulmonary embolism 1/18   . Peripheral arterial disease   . Vitiligo       RESULTRCNT(52w)    This note was completed using Engineer, production.  Grammatical errors, random word insertions, pronoun errors, incomplete sentences, and other errors are possible due to software limitations.  It is possible that I may have missed such errors when carefully reviewing the note. If the reader has questions or concerns regarding these errors or this note, please address them with me for clarification.     This note was electronically signed by Darlina Sicilian, MD

## 2019-01-07 ENCOUNTER — Encounter (INDEPENDENT_AMBULATORY_CARE_PROVIDER_SITE_OTHER): Payer: Self-pay

## 2019-01-09 ENCOUNTER — Other Ambulatory Visit (RURAL_HEALTH_CENTER): Payer: Self-pay

## 2019-01-09 MED ORDER — ATORVASTATIN CALCIUM 40 MG PO TABS
40.0000 mg | ORAL_TABLET | Freq: Every day | ORAL | 1 refills | Status: DC
Start: ? — End: 2019-01-09

## 2019-01-09 MED ORDER — ELIQUIS 5 MG PO TABS
ORAL_TABLET | ORAL | 1 refills | Status: DC
Start: ? — End: 2019-01-09

## 2019-01-09 MED ORDER — OMEPRAZOLE 20 MG PO CPDR
DELAYED_RELEASE_CAPSULE | ORAL | 1 refills | Status: DC
Start: 2019-01-09 — End: 2019-06-26

## 2019-01-09 MED ORDER — DOXAZOSIN MESYLATE 8 MG PO TABS
8.0000 mg | ORAL_TABLET | Freq: Every evening | ORAL | 1 refills | Status: DC
Start: ? — End: 2019-01-09

## 2019-01-09 MED ORDER — AMLODIPINE BESYLATE 5 MG PO TABS
5.0000 mg | ORAL_TABLET | Freq: Every day | ORAL | 1 refills | Status: DC
Start: ? — End: 2019-01-09

## 2019-01-28 ENCOUNTER — Telehealth (RURAL_HEALTH_CENTER): Payer: Self-pay

## 2019-01-28 NOTE — Telephone Encounter (Signed)
Pharmacy left message regarding Eliquis 5 mg script due to patient reports he is on 2.5 mg and not aware of an increase in dose.    Called patient to discuss and no answer. According to [facility] note at last office visit     "His dose of Eliquis was dropped down to 2.5 mg twice daily when his renal function dropped, but now that he is back to normal, his Eliquis is back up to 5 mg twice daily."

## 2019-01-29 NOTE — Telephone Encounter (Signed)
Patient is supposed to be on 5 mg Eliquis twice daily since his creatinine is less than 1.5 and his age is less than 79 ---JWT

## 2019-01-29 NOTE — Telephone Encounter (Signed)
Can PCP clarify patients dose of eliquis. Patient thought once dropped to 2.5 mg twice daily it was to stay there but according to note since renal function is back to normal patient should be on 5 mg twice daily. Please advise.

## 2019-01-29 NOTE — Telephone Encounter (Signed)
Called patient and patient voiced understanding.

## 2019-02-01 ENCOUNTER — Encounter (INDEPENDENT_AMBULATORY_CARE_PROVIDER_SITE_OTHER): Payer: Self-pay

## 2019-03-04 ENCOUNTER — Encounter (INDEPENDENT_AMBULATORY_CARE_PROVIDER_SITE_OTHER): Payer: Self-pay

## 2019-03-13 ENCOUNTER — Other Ambulatory Visit (RURAL_HEALTH_CENTER): Payer: Self-pay

## 2019-03-13 MED ORDER — TRIAMCINOLONE ACETONIDE 0.1 % EX CREA
TOPICAL_CREAM | Freq: Two times a day (BID) | CUTANEOUS | 1 refills | Status: DC
Start: 2019-03-13 — End: 2021-02-09

## 2019-06-12 ENCOUNTER — Ambulatory Visit (RURAL_HEALTH_CENTER): Payer: Medicare Other | Admitting: Family Medicine

## 2019-06-26 ENCOUNTER — Other Ambulatory Visit
Admission: RE | Admit: 2019-06-26 | Discharge: 2019-06-26 | Disposition: A | Discharge status: Home / Self Care | Payer: Medicare Other | Source: Ambulatory Visit | Attending: Family Medicine | Admitting: Family Medicine

## 2019-06-26 ENCOUNTER — Ambulatory Visit: Payer: Medicare Other | Attending: Family Medicine | Admitting: Family Medicine

## 2019-06-26 ENCOUNTER — Encounter (RURAL_HEALTH_CENTER): Payer: Self-pay | Admitting: Family Medicine

## 2019-06-26 VITALS — BP 140/62 | HR 71 | Temp 97.5°F | Resp 16 | Ht 69.0 in | Wt 206.1 lb

## 2019-06-26 DIAGNOSIS — Z0000 Encounter for general adult medical examination without abnormal findings: Secondary | ICD-10-CM

## 2019-06-26 DIAGNOSIS — I2699 Other pulmonary embolism without acute cor pulmonale: Secondary | ICD-10-CM

## 2019-06-26 DIAGNOSIS — I351 Nonrheumatic aortic (valve) insufficiency: Secondary | ICD-10-CM

## 2019-06-26 DIAGNOSIS — I739 Peripheral vascular disease, unspecified: Secondary | ICD-10-CM

## 2019-06-26 DIAGNOSIS — E7800 Pure hypercholesterolemia, unspecified: Secondary | ICD-10-CM

## 2019-06-26 DIAGNOSIS — T148XXA Other injury of unspecified body region, initial encounter: Secondary | ICD-10-CM

## 2019-06-26 DIAGNOSIS — I10 Essential (primary) hypertension: Secondary | ICD-10-CM

## 2019-06-26 DIAGNOSIS — K219 Gastro-esophageal reflux disease without esophagitis: Secondary | ICD-10-CM

## 2019-06-26 DIAGNOSIS — Z23 Encounter for immunization: Secondary | ICD-10-CM

## 2019-06-26 DIAGNOSIS — E669 Obesity, unspecified: Secondary | ICD-10-CM

## 2019-06-26 DIAGNOSIS — L80 Vitiligo: Secondary | ICD-10-CM

## 2019-06-26 LAB — CBC AND DIFFERENTIAL
Basophils %: 0.2 % (ref 0.0–3.0)
Basophils Absolute: 0 10*3/uL (ref 0.0–0.3)
Eosinophils %: 3.7 % (ref 0.0–7.0)
Eosinophils Absolute: 0.2 10*3/uL (ref 0.0–0.8)
Hematocrit: 43.5 % (ref 39.0–52.5)
Hemoglobin: 14.1 gm/dL (ref 13.0–17.5)
Lymphocytes Absolute: 1.2 10*3/uL (ref 0.6–5.1)
Lymphocytes: 19.1 % (ref 15.0–46.0)
MCH: 29 pg (ref 28–35)
MCHC: 32 gm/dL (ref 32–36)
MCV: 90 fL (ref 80–100)
MPV: 10.6 fL — ABNORMAL HIGH (ref 6.0–10.0)
Monocytes Absolute: 0.8 10*3/uL (ref 0.1–1.7)
Monocytes: 12.2 % (ref 3.0–15.0)
Neutrophils %: 64.8 % (ref 42.0–78.0)
Neutrophils Absolute: 4 10*3/uL (ref 1.7–8.6)
PLT CT: 141 10*3/uL (ref 130–440)
RBC: 4.84 10*6/uL (ref 4.00–5.70)
RDW: 13.4 % (ref 11.0–14.0)
WBC: 6.2 10*3/uL (ref 4.0–11.0)

## 2019-06-26 LAB — LIPD PANEL WITH NON-HDL CHOLESTEROL, SERUM
Cholesterol: 128 mg/dL (ref 75–199)
Coronary Heart Disease Risk: 2.33
HDL: 55 mg/dL (ref 40–55)
LDL Calculated: 60 mg/dL
NON HDL CHOLESTEROL: 73 mg/dL
Triglycerides: 64 mg/dL (ref 10–150)
VLDL: 13 (ref 0–40)

## 2019-06-26 LAB — BASIC METABOLIC PANEL
Anion Gap: 13.9 mMol/L (ref 7.0–18.0)
BUN / Creatinine Ratio: 10.4 Ratio (ref 10.0–30.0)
BUN: 12 mg/dL (ref 7–22)
CO2: 25 mMol/L (ref 20–30)
Calcium: 9.1 mg/dL (ref 8.5–10.5)
Chloride: 105 mMol/L (ref 98–110)
Creatinine: 1.15 mg/dL (ref 0.80–1.30)
EGFR: 65 mL/min/{1.73_m2} (ref 60–150)
Glucose: 93 mg/dL (ref 71–99)
Osmolality Calculated: 277 mOsm/kg (ref 275–300)
Potassium: 4.9 mMol/L (ref 3.5–5.3)
Sodium: 139 mMol/L (ref 136–147)

## 2019-06-26 MED ORDER — OMEPRAZOLE 20 MG PO CPDR
DELAYED_RELEASE_CAPSULE | ORAL | 0 refills | Status: DC
Start: 2019-06-26 — End: 2019-09-28

## 2019-06-26 MED ORDER — ATORVASTATIN CALCIUM 40 MG PO TABS
40.0000 mg | ORAL_TABLET | Freq: Every day | ORAL | 1 refills | Status: DC
Start: ? — End: 2019-06-26

## 2019-06-26 MED ORDER — AMLODIPINE BESYLATE 5 MG PO TABS
5.0000 mg | ORAL_TABLET | Freq: Every day | ORAL | 1 refills | Status: DC
Start: ? — End: 2019-06-26

## 2019-06-26 MED ORDER — APIXABAN 5 MG PO TABS
5.0000 mg | ORAL_TABLET | Freq: Two times a day (BID) | ORAL | 3 refills | Status: DC
Start: ? — End: 2019-06-26

## 2019-06-26 MED ORDER — DOXAZOSIN MESYLATE 8 MG PO TABS
8.0000 mg | ORAL_TABLET | Freq: Every evening | ORAL | 1 refills | Status: DC
Start: ? — End: 2019-06-26

## 2019-06-26 NOTE — Progress Notes (Signed)
Progress Note: 06/26/2019     Last Core Note 06/26/2019, next Core f/u due 06/26/19.               Noah Ali,   Date of birth: [birth year], 68 y.o., male     Todays labs were drawn while nonfasting, goal non HDL < 130 and is on a statin   "Results Review" selected to review presence/absence of prior labs                Addendums: if there any, they can be found in the Problem Summary List under "Dr. Burnadette Pop Health Maintenance/Addendums/Outside of [facility] & other Studies/Fhx/Shx/Vaccinations/Surgeries"    BP 140/62    Pulse 71    Temp 97.5 F (36.4 C) (Tympanic)    Resp 16    Ht 1.753 m (5\' 9" )    Wt 93.5 kg (206 lb 1.9 oz)    SpO2 97%    BMI 30.44 kg/m        Chief Complaint: Pt here for Chronic medical problems followup:    Subjective:   This 68 y.o. male presents for evaluation of the following medical condition(s):    1) Hypertension on the medications listed with good compliance.Pt is monitoring home blood pressures with most <140/90. Most office blood pressure readings are <140/90.   BP Readings from Last 3 Encounters:   06/26/19 140/62   12/12/18 130/82   11/14/18 174/77       We received a message from his insurance company February 2020 with concern about and adrenergic antihypertensive medications in older adults-he is tolerating it well, and is not having any urinary symptoms    In review: Pt experienced acute kidney injury with peak creatinine at 1.76 during admission 1/18. ACE inhibitor and HCTZ withheld with renal function returning to normal. Discharge creatinine 1.38 with most recent creatinine1.17 7/19    2) Hyperlipidemia on Lipitor 40 mg withrisk of MI/CVAfor 10 years based on cholesterol risk calculator was 11% and he does have peripheral arterial disease as well. He has had excellentlipid profiles consecutively since on Lipitor.  Patient's insurance company wrote [place] May 2020 stating that he may have stopped using Lipitor.    3) GERD s/p EGD 5/06 on prn prilosec and under good  control; averages 1-2pills/week. He is also taking a B12 vitamin    4) Acute nonprovoked pulmonary embolism sustained 1/18 and now on Eliquis 5 mg twice daily.    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 Other tests for hypercoagulation came back negative including factor V Leiden. His dose of Eliquis was dropped down to 2.5 mg twice daily when his renal function dropped, but now that he is back to normal, his Eliquis is back up to 5 mg twice daily. PEfound based on CT angiogram chest. Duplex ultrasound of lower extremities came back normal/negative. There still is a little residual flank area discomfort on the right    Denies shortness of breath    5) Renal cyst on the right found on CT at 12.1 by 12.1 cm size with small amount of wall calcification. Bilateral non-worrisome renal cysts were found as well measuring up to 2.3 cm in size. Patient underwent a laparoscopic right renal cystectomy with benign path foundand has been released by urology    6) Chronic pain issues: LBP that is recurrent with last flare resulting in visit 07/10/2018.  Left knee painstatus post arthroscopy and patient was told by his orthopedic surgeon that he would probably need to have a total right replacement,    7) Overweight and not on a diet; we have advised low carb diet. The most he has ever weighed is 233 lbs  Wt Readings from Last 3 Encounters:   06/26/19 93.5 kg (206 lb 1.9 oz)   12/12/18 98 kg (216 lb)   11/14/18 106 kg (233 lb 11 oz)       8) Mild aortic insufficiency and mild mitral regurge based on echocardiogram 9/17; stable from '13 echo. Next echo due September 2020    9) Chronic CPK elevations, mild with normal values 3 since December 2014. We are no longer checkingCPK's    Denies myalgias or arthralgias. CPK last visit was normal, although mildly elevated the time before      10) Mild, intermittent anemia with neg w/u. All CBCs since 5/14 have been normal with the exception of February 2018 visit which was a post hospitalization/PE visit. Most recent CBC 3/18and 9/18normal    11) Acute kidney injury with peak creatinine at 1.76 during admission 1/18. ACE inhibitor and HCTZ withheld with renal function returning to normal. He was placed on Norvascand subsequently Cardurato replace these other medicines which he remains on.  Most recent creatinine 1.08 December 2018    12) Former smoker who quit around age 66 and had a normal abdominal aorta on CT 1/18of his abdomen.     13) Decreased albumin/globulin ratio 1/18 admission with f/u SIEP nl/neg    14)Vitiligo legscharacterized by hypopigmented skin on both shins which started developing around age 23.He was followed by dermatology and was on tacrolimus in the past, but currently triamcinolone    15) Left olecranon bursitis for which the patient was seen in the [facility] emergency room 11/14/2018 with normal/negative x-ray other than fluid in the bursa area.  This has resolved    16) PAD based on CT scan showing mild aortic calcification January 2018.  Goal LDL less than 100    17) Periodic nicks and abrasions and he is due for a tetanus shot    Hepatitis C screen 3/18 negative,and Cologuard negative 12/18    Review of Systems.   Denies chest pain, shortness of breath, side effects, or past medical history of CAD, CVA, or PAD.    OBJECTIVE:   -Overweight Pt in NAD   -Neck: supple, without bruits   -Respiratory: CTA   -Cardiovascular: RRR with 1/6 SEM precordial area w/o gallops   -Abdomen: soft and nontender   -Extremities: Without edema     Assessment/Plan: Labs were drawn while fasting.   1) Hypertension with good control   -Exercise, prescription and diet are key.   F/U in 6 months    2) Hyperlipidemia; labs today     3) GERD s/p EGD 5/06 on prn prilosec and under good control   Advise he continue B12 1 mg  daily    4) Chronic CPK elevations, mild. We have stoppedchecking CPKs since he has had multiple normal values consecutively    5) Mild, intermittent anemia with neg w/u; repeat CBC today    6) LBPand knee pain followed by orthopedics    7) Non morbid obesity; again advise low-carb diet-good weight loss    8) Aortic insufficiency mild 9 /17 with next echo due 9/20    9) Large renal cyst excised and benign    10) Vitiligoper dermatology    11)  Peripheral arterial disease with goal LDL less than 100    17) Periodic nicks and abrasions with tetanus shot ordered today    Patient declines flu shot today-advised Shingrix    Fall risk negligable: the patient was evaluated for risk for falls and determined not to be at risk. Timed (20 seconds) get up and go test was negative.    .ccmrevup  Patient declines to sign up for CCM Revup    Pt declines toschedule annual Medicare Wellness Visit with Leontine Locket     Follow-up6 months    16109: 40 minutes or more spent with patient, greater than 50% of the office visit was dedicated to counseling, reviewing tests & labs, treatment options and follow up plans.             ---------------------------------------------------------------------------------------------------------   The Progress  note for today is completed above in a SOAP note format. The patients allergies, med list, and Problem Summary List are recorded below.  ---------------------------------------------------------------------------------------------------------      For those patients followed by me, please go to the problem summary list and look under 'Overview' "Dr. Burnadette Pop Health Maintenance/Addendums/Outside of [facility] & other Studies/Fhx/Shx/Vaccinations/Surgeries" which was reviewed and updated (if indicated) this date    No Known Allergies    Current Outpatient Medications on File Prior to Visit   Medication Sig Dispense Refill    amLODIPine (NORVASC) 5 MG tablet Take 1  tablet (5 mg total) by mouth daily 90 tablet 1    apixaban (Eliquis) 5 MG Take 5 mg by mouth every 12 (twelve) hours      atorvastatin (LIPITOR) 40 MG tablet Take 1 tablet (40 mg total) by mouth daily 90 tablet 1    cyanocobalamin 1000 MCG tablet Take 1,000 mcg by mouth daily      doxazosin (CARDURA) 8 MG tablet Take 1 tablet (8 mg total) by mouth nightly 90 tablet 1    omeprazole (PRILOSEC) 20 MG capsule Take one capsule daily 90 capsule 1    triamcinolone (KENALOG) 0.1 % cream Apply topically 2 (two) times daily 30 g 1    [DISCONTINUED] ELIQUIS 5 MG TAKE 2 TABLETS BY MOUTH TWICE A DAY FOR 7 DAYS, THEN TAKE 1 TWICE A DAY FOR 21 DAYS 180 tablet 1     No current facility-administered medications on file prior to visit.        Patient Active Problem List   Diagnosis    Pure hypercholesterolemia    CPK elevation, chronic    Esophageal reflux s/p EGD 5/06    Headache(784.0)    HTN    Lumbago    Anemia, neg w/u in past    Dr. Burnadette Pop Health Maintenance/Addendums/Outside of [facility] & other Studies/Fhx/Shx/Vaccinations/Surgeries    Non morbid obesity    Aortic insufficiency-4/13 echo    Pulmonary embolism 1/18    Peripheral arterial disease    Vitiligo       RESULTRCNT(52w)    This note was completed using Dragon medical speech/voice recognition technology.  Grammatical errors, random word insertions, pronoun errors, incomplete sentences, and other errors are possible due to software limitations.  It is possible that I may have missed such errors when carefully reviewing the note. If the reader has questions or concerns regarding these errors or this note, please address them with me for clarification.     This note was electronically signed by Darlina Sicilian, MD

## 2019-06-26 NOTE — Progress Notes (Signed)
Date Specimen Drawn:  06/26/2019   Time Specimen Drawn:  10:47 AM   Test(s) Ordered:  Lipid, BMP, CBC   Disposition:  n/a   Patient's Tolerance:  Good   Location Specimen Drawn:  Left antecubital

## 2019-06-27 NOTE — Progress Notes (Signed)
Please ask pt to sign up for MyChart if not already done when you call them with the below lab results.  Give them instructions on how to do so.   If they sign up, I will start sending results directly to them rather than through you----jwt    Please call pt with the following lab results and instructions:    CBC: normal/negative test  Kidney function test: normal/negative test  Cholesterol/Lipid profile: normal; target cholesterol level attained; please continue your cholesterol medication(s)   Blood sugar: Normal; no evidence of diabetes, prediabetes, or low blood sugar    Please do not discontinue or change any of your medications unless instructed to do so. Keep your next scheduled visit.    Raquell Richer W Talin Feister, MD

## 2019-08-21 ENCOUNTER — Other Ambulatory Visit (RURAL_HEALTH_CENTER): Payer: Self-pay | Admitting: Family Medicine

## 2019-08-21 ENCOUNTER — Telehealth (RURAL_HEALTH_CENTER): Payer: Self-pay | Admitting: Family Medicine

## 2019-08-21 DIAGNOSIS — I340 Nonrheumatic mitral (valve) insufficiency: Secondary | ICD-10-CM

## 2019-08-21 DIAGNOSIS — I351 Nonrheumatic aortic (valve) insufficiency: Secondary | ICD-10-CM

## 2019-08-21 NOTE — Telephone Encounter (Signed)
Done ---JWT

## 2019-08-21 NOTE — Telephone Encounter (Signed)
Patient is due for their 3-year echocardiogram.  Please call and see if he will allow [place] to order this at [facility] ---JWT    -------------  9/17 mild AR and MR and due for repeat echo now.

## 2019-08-21 NOTE — Telephone Encounter (Signed)
Called patient and patient agrees to repeat echo @ [facility].

## 2019-09-08 ENCOUNTER — Ambulatory Visit
Admission: RE | Admit: 2019-09-08 | Discharge: 2019-09-08 | Disposition: A | Discharge status: Home / Self Care | Payer: Medicare Other | Source: Ambulatory Visit | Attending: Family Medicine | Admitting: Family Medicine

## 2019-09-08 DIAGNOSIS — I340 Nonrheumatic mitral (valve) insufficiency: Secondary | ICD-10-CM | POA: Insufficient documentation

## 2019-09-08 DIAGNOSIS — I351 Nonrheumatic aortic (valve) insufficiency: Secondary | ICD-10-CM | POA: Insufficient documentation

## 2019-09-08 NOTE — Progress Notes (Signed)
Please ask pt to sign up for MyChart if not already done when you call them with the below lab results.  Give them instructions on how to do so.   If they sign up, I will start sending results directly to them rather than through you----jwt    Please call pt with the following lab results and instructions:    Echocardiogram stable to a little bit improved.  Repeat echocardiogram due in 3 years test test testing with    Please do not discontinue or change any of your medications unless instructed to do so. Keep your next scheduled visit.    Darlina Sicilian, MD

## 2019-09-28 ENCOUNTER — Other Ambulatory Visit (RURAL_HEALTH_CENTER): Payer: Self-pay | Admitting: Family Medicine

## 2019-12-30 ENCOUNTER — Other Ambulatory Visit (RURAL_HEALTH_CENTER): Payer: Self-pay | Admitting: Family Medicine

## 2020-01-08 ENCOUNTER — Ambulatory Visit (RURAL_HEALTH_CENTER): Payer: Self-pay | Admitting: Family Medicine

## 2020-03-18 ENCOUNTER — Ambulatory Visit: Payer: Medicare Other | Attending: Family Medicine | Admitting: Family Medicine

## 2020-03-18 ENCOUNTER — Encounter (RURAL_HEALTH_CENTER): Payer: Self-pay | Admitting: Family Medicine

## 2020-03-18 ENCOUNTER — Other Ambulatory Visit
Admission: RE | Admit: 2020-03-18 | Discharge: 2020-03-18 | Disposition: A | Discharge status: Home / Self Care | Payer: Medicare Other | Source: Ambulatory Visit | Attending: Family Medicine | Admitting: Family Medicine

## 2020-03-18 VITALS — BP 136/80 | HR 72 | Temp 97.4°F | Wt 207.8 lb

## 2020-03-18 DIAGNOSIS — K219 Gastro-esophageal reflux disease without esophagitis: Secondary | ICD-10-CM

## 2020-03-18 DIAGNOSIS — M545 Low back pain: Secondary | ICD-10-CM

## 2020-03-18 DIAGNOSIS — G8929 Other chronic pain: Secondary | ICD-10-CM

## 2020-03-18 DIAGNOSIS — I739 Peripheral vascular disease, unspecified: Secondary | ICD-10-CM

## 2020-03-18 DIAGNOSIS — I10 Essential (primary) hypertension: Secondary | ICD-10-CM

## 2020-03-18 DIAGNOSIS — E7800 Pure hypercholesterolemia, unspecified: Secondary | ICD-10-CM

## 2020-03-18 DIAGNOSIS — Z0000 Encounter for general adult medical examination without abnormal findings: Secondary | ICD-10-CM

## 2020-03-18 LAB — CBC AND DIFFERENTIAL
Basophils %: 0.5 % (ref 0.0–3.0)
Basophils Absolute: 0 10*3/uL (ref 0.0–0.3)
Eosinophils %: 5.5 % (ref 0.0–7.0)
Eosinophils Absolute: 0.3 10*3/uL (ref 0.0–0.8)
Hematocrit: 41.3 % (ref 39.0–52.5)
Hemoglobin: 12.4 gm/dL — ABNORMAL LOW (ref 13.0–17.5)
Lymphocytes Absolute: 1.1 10*3/uL (ref 0.6–5.1)
Lymphocytes: 20.7 % (ref 15.0–46.0)
MCH: 26 pg — ABNORMAL LOW (ref 28–35)
MCHC: 30 gm/dL — ABNORMAL LOW (ref 32–36)
MCV: 88 fL (ref 80–100)
MPV: 10.8 fL — ABNORMAL HIGH (ref 6.0–10.0)
Monocytes Absolute: 0.7 10*3/uL (ref 0.1–1.7)
Monocytes: 12.5 % (ref 3.0–15.0)
Neutrophils %: 60.8 % (ref 42.0–78.0)
Neutrophils Absolute: 3.2 10*3/uL (ref 1.7–8.6)
PLT CT: 153 10*3/uL (ref 130–440)
RBC: 4.7 10*6/uL (ref 4.00–5.70)
RDW: 13.3 % (ref 11.0–14.0)
WBC: 5.3 10*3/uL (ref 4.0–11.0)

## 2020-03-18 LAB — BASIC METABOLIC PANEL
Anion Gap: 13.8 mMol/L (ref 7.0–18.0)
BUN / Creatinine Ratio: 14.3 Ratio (ref 10.0–30.0)
BUN: 16 mg/dL (ref 7–22)
CO2: 22.3 mMol/L (ref 20.0–30.0)
Calcium: 8.6 mg/dL (ref 8.5–10.5)
Chloride: 106 mMol/L (ref 98–110)
Creatinine: 1.12 mg/dL (ref 0.80–1.30)
EGFR: 67 mL/min/{1.73_m2} (ref 60–150)
Glucose: 89 mg/dL (ref 71–99)
Osmolality Calculated: 276 mOsm/kg (ref 275–300)
Potassium: 4.1 mMol/L (ref 3.5–5.3)
Sodium: 138 mMol/L (ref 136–147)

## 2020-03-18 LAB — LIPD PANEL WITH NON-HDL CHOLESTEROL, SERUM
Cholesterol: 120 mg/dL (ref 75–199)
Coronary Heart Disease Risk: 2.31
HDL: 52 mg/dL (ref 40–55)
LDL Calculated: 59 mg/dL
NON HDL CHOLESTEROL: 68 mg/dL
Triglycerides: 46 mg/dL (ref 10–150)
VLDL: 9 (ref 0–40)

## 2020-03-18 MED ORDER — DOXAZOSIN MESYLATE 8 MG PO TABS
ORAL_TABLET | ORAL | 1 refills | Status: DC
Start: ? — End: 2020-03-18

## 2020-03-18 MED ORDER — AMLODIPINE BESYLATE 5 MG PO TABS
5.0000 mg | ORAL_TABLET | Freq: Every day | ORAL | 1 refills | Status: DC
Start: ? — End: 2020-03-18

## 2020-03-18 MED ORDER — ATORVASTATIN CALCIUM 40 MG PO TABS
40.0000 mg | ORAL_TABLET | Freq: Every day | ORAL | 1 refills | Status: DC
Start: ? — End: 2020-03-18

## 2020-03-18 MED ORDER — OMEPRAZOLE 20 MG PO CPDR
DELAYED_RELEASE_CAPSULE | ORAL | 0 refills | Status: DC
Start: 2020-03-18 — End: 2020-09-16

## 2020-03-18 MED ORDER — APIXABAN 5 MG PO TABS
5.0000 mg | ORAL_TABLET | Freq: Two times a day (BID) | ORAL | 3 refills | Status: DC
Start: ? — End: 2020-03-18

## 2020-03-18 NOTE — Progress Notes (Signed)
Date Specimen Drawn:  03/18/2020   Time Specimen Drawn:  8:50 AM   Test(s) Ordered:  Cbc bmp lipid   Disposition:  n/a   Patient's Tolerance:  Good   Location Specimen Drawn:  Right antecubital

## 2020-03-18 NOTE — Progress Notes (Signed)
Progress Note: 03/18/2020     The top chronic medical problems (but not the complete list) addressed on (03/18/2020 ) as a Semi-core note:  Last Core Note 06/26/2019, next Core f/u due 09/17/20       Noah Ali,   Date of birth: [birth year], 69 y.o., male     Todays labs were drawn while nonfasting, goal non HDL < 130 and is on a statin             Addendums: if there any, they can be found in the Problem Summary List under "Dr. Burnadette Pop Health Maintenance/Addendums/Outside of [facility] & other Studies/Fhx/Shx/Vaccinations/Surgeries"    BP 136/80   Pulse 72   Temp 97.4 F (36.3 C) (Temporal)   Wt 94.3 kg (207 lb 12.8 oz)   SpO2 99%   BMI 30.69 kg/m          Chief Complaint: Pt here for Chronic medical problems followup:    Subjective:   This 69 y.o. male presents for evaluation of the following medical condition(s):     -7 minutes chart review spent in preparation for this note     1) Hypertension on the medications listed with good compliance.Pt is monitoring home blood pressures with most <140/90. Most office blood pressure readings are <140/90.   BP Readings from Last 3 Encounters:   03/18/20 136/80   06/26/19 140/62   12/12/18 130/82       In review: Pt experienced acute kidney injury with peak creatinine at 1.76 during admission 1/18. ACE inhibitor and HCTZ withheld with renal function returning to normal. Discharge creatinine 1.38 with most recent creatinine1.10 June 2019    2) Hyperlipidemia on Lipitor 40 mg withrisk of MI/CVA for 10 years based on cholesterol risk calculator was 11% and he does have peripheral arterial disease as well. He has had excellentlipid profiles consecutively since on Lipitor.  Patient's insurance company wrote [place] May 2020 stating that he may have stopped using Lipitor.    3) GERD s/p EGD 5/06 on prn prilosec and under good control; averages 1-3pills/week. He is also taking a B12 vitamin    4) Acute nonprovoked pulmonary embolism sustained 1/18 and now on  Eliquis 5 mg twice daily lifelong.    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 Other tests for hypercoagulation came back negative including factor V Leiden. His dose of Eliquis was dropped down to 2.5 mg twice daily when his renal function dropped, but now that he is back to normal, his Eliquis is back up to 5 mg twice daily. PEfound based on CT angiogram chest. Duplex ultrasound of lower extremities came back normal/negative. There still is a little residual flank area discomfort on the right    Denies shortness of breath    5) Chronic pain issues:LBP that is recurrent.  Left knee painstatus post arthroscopy and patient was told by his orthopedic surgeon that he would probably need to have a total right replacement.  Doing OK    6) Overweight and not on a diet; we have advised low carb diet. The most he has ever weighed is 233lbs   Wt Readings from Last 3 Encounters:   03/18/20 94.3 kg (207 lb 12.8 oz)   06/26/19 93.5 kg (206 lb 1.9 oz)   12/12/18 98 kg (216 lb)       7) Mild aortic insufficiency and mild mitral regurge  based on echocardiograms 2013 and 9/17 with essentially no change on 10/20 echo.Next echo due September 2023    8) PAD based on CT scan showing mild aortic calcification January 2018.  Goal LDL less than 100    Hepatitis C screen 3/18 negative,and Cologuard negative 12/18    Review of Systems.   Denies chest pain, shortness of breath, side effects, or past medical history of CAD, CVA, or PAD.    OBJECTIVE:   -Overweight Pt in NAD   -Neck: supple, without bruits   -Respiratory: CTA   -Cardiovascular: RRR with 1/6 SEM precordial area w/o gallops   -Abdomen: soft and nontender   -Extremities: Without edema     Assessment/Plan:  1) Hypertension good control  No change with labs today  Goal blood pressure less than 140/90  Follow-up 6 months    2)  Hyperlipidemia on Lipitor 40 with labs today    3) GERD s/p EGD 5/06 on prn prilosec and under good control; averages 1-3pills/week. He is also taking a B12 vitamin.  No change to    4) Acute nonprovoked pulmonary embolism sustained 1/18 and now on Eliquis 5 mg twice daily lifelong.  He is not interested in switching over to Coumadin, but would like a cheaper medicine than Eliquis.  I have asked him to call his insurance company regarding DOAC's for the best price    5) Chronic pain issues:LBP that is recurrent.    Tylenol around the clock (appropriate dosing discussed and not within 8 hours of other tylenol containing products)  SAM-E  Topical diclofenac gel  Ice treatments (consider gel pack in pouch, apply up to 20 minutes TID)       6) Overweight-advise a low-carb diet, weight watchers, or other reputable diet.  Advise he exercise 20 to 60 minutes/day at the pace of a brisk walk    7) Mild aortic insufficiency and mild mitral regurge based on echocardiograms 2013 and 9/17 with essentially no change on 10/20 echo.Next echo due September 2023    8) PAD based on CT scan showing mild aortic calcification January 2018.  Goal LDL less than 100    Health maintenance: Flu shot Declined, Covid 19 vaccine Declined, Advise Shingrix series x2      Fall risk negligable: the patient was evaluated for risk for falls and determined not to be at risk.  Patient does not feel they are a fall risk, and timed (20 seconds) get up and go test was either negative, or the patient was observed to walk without difficulty.     Patient declines to sign up for CCM Revup     Patient declines to schedule annual Medicare Wellness Visit with Leontine Locket     Follow-up 6 months       ---------------------------------------------------------------------------------------------------------   The Progress  note for today is completed above in a SOAP note format. The patients allergies, med list, and Problem Summary List are recorded  below, as well as a list of helpful smart phrases.  ---------------------------------------------------------------------------------------------------------      For those patients followed by me, please go to the problem summary list and look under 'Overview' "Dr. Burnadette Pop Health Maintenance/Addendums/Outside of [facility] & other Studies/Fhx/Shx/Vaccinations/Surgeries" which was reviewed and updated (if indicated) this date    Smart Phrases starting with "JWT":   *ADD           *colic                 *HTNmeds      *  NSAIDS                 *PSA  *Asthma       *constipation     *laceration       *peripheraledema   *truancy  *BRCA         *dementia         *naturalmeds   *polycythemia    No Known Allergies    Current Outpatient Medications on File Prior to Visit   Medication Sig Dispense Refill   . amLODIPine (NORVASC) 5 MG tablet TAKE 1 TABLET(5 MG) BY MOUTH DAILY 90 tablet 0   . apixaban (Eliquis) 5 MG Take 1 tablet (5 mg total) by mouth every 12 (twelve) hours 180 tablet 3   . atorvastatin (LIPITOR) 40 MG tablet TAKE 1 TABLET(40 MG) BY MOUTH DAILY 90 tablet 0   . b complex vitamins tablet Take 1 tablet by mouth daily     . cyanocobalamin 1000 MCG tablet Take 1,000 mcg by mouth daily     . doxazosin (CARDURA) 8 MG tablet TAKE 1 TABLET(8 MG) BY MOUTH EVERY NIGHT 90 tablet 0   . omeprazole (PriLOSEC) 20 MG capsule TAKE 1 CAPSULE BY MOUTH DAILY 90 capsule 0   . triamcinolone (KENALOG) 0.1 % cream Apply topically 2 (two) times daily 30 g 1     No current facility-administered medications on file prior to visit.       Patient Active Problem List   Diagnosis   . Pure hypercholesterolemia   . CPK elevation, chronic   . Esophageal reflux s/p EGD 5/06   . Headache(784.0)   . HTN   . Lumbago   . Anemia, neg w/u in past   . Dr. Burnadette Pop Health Maintenance/Addendums/Outside of [facility] & other Studies/Fhx/Shx/Vaccinations/Surgeries   . Non morbid obesity   . Aortic insufficiency-4/13 echo   . Pulmonary embolism  1/18   . Peripheral arterial disease   . Vitiligo       RESULTRCNT(52w)    This note was completed using Engineer, production.  Grammatical errors, random word insertions, pronoun errors, incomplete sentences, and other errors are possible due to software limitations.  It is possible that I may have missed such errors when carefully reviewing the note. If the reader has questions or concerns regarding these errors or this note, please address them with me for clarification.     This note was electronically signed by Darlina Sicilian, MD

## 2020-03-19 NOTE — Progress Notes (Signed)
Please ask pt to sign up for MyChart if not already done when you call them with the below lab results.  Give them instructions on how to do so.   If they sign up, I will start sending results directly to them rather than through you----jwt    Please call pt with the following lab results and instructions:    CBC: normal/negative test  Kidney function test: normal/negative test  Cholesterol/Lipid profile: normal; target cholesterol level attained; please continue your cholesterol medication(s)   Blood sugar: Normal; no evidence of diabetes, prediabetes, or low blood sugar    Please do not discontinue or change any of your medications unless instructed to do so. Keep your next scheduled visit.    Sharise Lippy W Leonte Horrigan, MD

## 2020-09-16 ENCOUNTER — Encounter (RURAL_HEALTH_CENTER): Payer: Self-pay | Admitting: Family Medicine

## 2020-09-16 ENCOUNTER — Telehealth (RURAL_HEALTH_CENTER): Payer: Self-pay

## 2020-09-16 ENCOUNTER — Ambulatory Visit: Payer: Medicare Other | Attending: Family Medicine | Admitting: Family Medicine

## 2020-09-16 VITALS — BP 120/70 | HR 80 | Temp 97.5°F | Resp 16 | Ht 67.0 in | Wt 212.0 lb

## 2020-09-16 DIAGNOSIS — Z0000 Encounter for general adult medical examination without abnormal findings: Secondary | ICD-10-CM

## 2020-09-16 DIAGNOSIS — E7800 Pure hypercholesterolemia, unspecified: Secondary | ICD-10-CM

## 2020-09-16 DIAGNOSIS — I739 Peripheral vascular disease, unspecified: Secondary | ICD-10-CM

## 2020-09-16 DIAGNOSIS — E669 Obesity, unspecified: Secondary | ICD-10-CM

## 2020-09-16 DIAGNOSIS — I351 Nonrheumatic aortic (valve) insufficiency: Secondary | ICD-10-CM

## 2020-09-16 DIAGNOSIS — K219 Gastro-esophageal reflux disease without esophagitis: Secondary | ICD-10-CM

## 2020-09-16 DIAGNOSIS — I2699 Other pulmonary embolism without acute cor pulmonale: Secondary | ICD-10-CM

## 2020-09-16 DIAGNOSIS — I10 Essential (primary) hypertension: Secondary | ICD-10-CM

## 2020-09-16 DIAGNOSIS — M545 Low back pain, unspecified: Secondary | ICD-10-CM

## 2020-09-16 DIAGNOSIS — R0989 Other specified symptoms and signs involving the circulatory and respiratory systems: Secondary | ICD-10-CM

## 2020-09-16 MED ORDER — APIXABAN 5 MG PO TABS
5.0000 mg | ORAL_TABLET | Freq: Two times a day (BID) | ORAL | 3 refills | Status: DC
Start: ? — End: 2020-09-16

## 2020-09-16 MED ORDER — OMEPRAZOLE 20 MG PO CPDR
DELAYED_RELEASE_CAPSULE | ORAL | 0 refills | Status: DC
Start: ? — End: 2020-09-16

## 2020-09-16 MED ORDER — DOXAZOSIN MESYLATE 8 MG PO TABS
ORAL_TABLET | ORAL | 1 refills | Status: DC
Start: ? — End: 2020-09-16

## 2020-09-16 MED ORDER — ATORVASTATIN CALCIUM 40 MG PO TABS
40.0000 mg | ORAL_TABLET | Freq: Every day | ORAL | 1 refills | Status: DC
Start: ? — End: 2020-09-16

## 2020-09-16 MED ORDER — AMLODIPINE BESYLATE 5 MG PO TABS
5.0000 mg | ORAL_TABLET | Freq: Every day | ORAL | 1 refills | Status: DC
Start: ? — End: 2020-09-16

## 2020-09-16 NOTE — Telephone Encounter (Signed)
Pt called asking for Carotid to be done in [place]

## 2020-09-16 NOTE — Addendum Note (Signed)
Addended by: Darlina Sicilian on: 09/16/2020 12:14 PM     Modules accepted: Level of Service

## 2020-09-16 NOTE — Telephone Encounter (Signed)
Will copy scheduling ---JWT

## 2020-09-16 NOTE — Progress Notes (Addendum)
Progress Note: 09/16/2020     Last Core Note 09/16/2020, next Core f/u due 09/16/21.  Additionally, F/U 6 months (03/17/21 ) for a semi-core note --Future core visits 20 minutes                  Noah Ali,   Date of birth: 1951-10-23, 69 y.o., male     Todays labs were drawn while nonfasting, goal non HDL < 130 and is on a statin             Addendums: if there any, they can be found in the Problem Summary List under "Dr. Burnadette Pop Health Maintenance/Addendums/Outside of [facility] & other Studies/Fhx/Shx/Vaccinations/Surgeries"    BP 120/70   Pulse 80   Temp 97.5 F (36.4 C)   Resp 16   Ht 1.702 m (5\' 7" )   Wt 96.2 kg (212 lb)   SpO2 97%   BMI 33.20 kg/m        Chief Complaint: Pt here for Chronic medical problems followup:    Subjective:   This 69 y.o. male presents for evaluation of the following medical condition(s):    -10 minutes chart review spent in preparation for this note    1) Hypertension on the medications listed with good compliance.Pt is monitoring home blood pressures with most <140/90. Most office blood pressure readings are <140/90.   BP Readings from Last 3 Encounters:   09/16/20 120/70   03/18/20 136/80   06/26/19 140/62       In review: Pt experienced acute kidney injury with peak creatinine at 1.76 during admission 1/18. ACE inhibitor and HCTZ withheld with renal function returning to normal. Discharge creatinine 1.38 with most recent creatinine1.10 June 2019    2) Hyperlipidemia on Lipitor 40 mg withrisk of MI/CVA for 10 years based on cholesterol risk calculator was 11%and he does have peripheral arterial disease as well. He has had excellentlipid profiles consecutively since on Lipitor.     3) GERD s/p EGD 5/06 on prn prilosec and under good control; averages 1-3pills/week. 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 Other tests for hypercoagulation came back negative including factor V Leiden. His dose of Eliquis was dropped down to 2.5 mg twice daily when his renal function dropped, but now that he is back to normal, his Eliquis is back up to 5 mg twice daily. PEfound based on CT angiogram chest. Duplex ultrasound of lower extremities came back normal/negative. There still is a little residual flank area discomfort on the right    Denies shortness of breath    5) Chronic pain issues:  -LBP that is recurrent. Doing okay at this time  -Left knee painstatus post arthroscopyand patient was told by his orthopedic surgeon that he would probably need to have a total right replacement.    Followed by Ortho    6) Overweight and not on a diet; we have advised low carb diet. The most he has ever weighed is 233lbs   Wt Readings from Last 3 Encounters:   09/16/20 96.2 kg (212 lb)   03/18/20 94.3 kg (207 lb 12.8 oz)   06/26/19 93.5 kg (206 lb 1.9 oz)       7)  Mild aortic insufficiency and mild mitral regurge based on echocardiograms 2013 and 9/17 with essentially no change on 10/20 echo.Next echo due September 2023    8) PAD based on CT scan showing mild aortic calcification January 2018. Goal LDL less than 100  ----------------------    9) Mild, intermittent anemia with neg w/u. All CBCs since 5/14 havebeen normal with the exception of February 2018 visit which was a post hospitalization/PE visit. Most recent CBC 3/18and 9/18normal    12) Acute kidney injury with peak creatinine at 1.76 during admission 1/18. ACE inhibitor and HCTZ withheld with renal function returning to normal. He was placed on Norvascand subsequently Cardurato replace these other medicines which he remains on. Most recent creatinine 1.12 in April 2021 consistent with GFR of 67     13)Vitiligo  legscharacterized by hypopigmented skin on both shins which started developing around age 66.He was followed by dermatology and was on tacrolimusin the past, but currently triamcinolone on an as-needed basis.  Stable    14) Next Cologuard will be due December 2021    Medical problems that are no longer active but could recur.  We are keeping these problems listed for reference should they recur/flair:    15) Chronic CPK elevations, mild with normal values 3 since December 2014. We are no longer checkingCPK's    Denies myalgias or arthralgias. CPK last visit was normal, although mildly elevated the time before     16) Renal cyst on the right found on CT at 12.1 by 12.1 cm size with small amount of wall calcification. Bilateral non-worrisome renal cysts were found as well measuring up to 2.3 cm in size. Patient underwent a laparoscopic right renal cystectomy with benign path foundand has been released by urology    17) Left olecranon bursitis for which the patient was seen in the [facility] emergency room 11/14/2018 with normal/negative x-ray other than fluid in the bursa area. This has resolved    18) Decreased albumin/globulin ratio 1/18 admission with f/u SIEP nl/neg    19) Former smoker whoquit around age 13 andhad a normal abdominal aorta on CT 1/18of his abdomen.     Hepatitis C screen 3/18 negative,and Cologuard negative 12/18    Review of Systems.   There is a history of PAD    Denies chest pain, shortness of breath, side effects, or past medical history of CAD, CVA    OBJECTIVE:   -Overweight Pt in NAD   -Neck: supple, soft bruit on the left side heard for the first time 09/16/2020   -Respiratory: CTA   -Cardiovascular: RRR with 1/6 SEM precordial area w/o gallops   -Abdomen: soft and nontender   -Extremities: Without edema     Assessment/Plan: Labs were drawn while fasting.   1) Hypertension under good control with goal blood pressure less than 140/90 which he is obtaining  Follow-up 6  months with no labs today     2) Hyperlipidemia under excellent control; no change    3) GERD s/p EGD 5/06 on prn prilosec and under good control; averages 1-3pills/week. He is also taking a B12 vitamin     4) Pulmonary embolism; unprovoked and remains on lifelong Eliquis.  Asymptomatic at this time    5) Chronic pain issues per orthopedics as needed    6) Overweight and not on a diet; advise low-carb diet, weight watchers, or other reputable diet  Exercise at the pace of a brisk walk for 30 minutes a day    7)  Mild aortic insufficiency and mild mitral regurge based on echocardiograms 2013 and 9/17 with essentially no change on 10/20 echo.Next echo due September 2023    8) PAD based on CT scan showing mild aortic calcification January 2018. Goal LDL less than 100    9)Vitiligo legscharacterized by hypopigmented skin on both shins which started developing around age 64.He was followed by dermatology and was on tacrolimusin the past, but currently triamcinolone on an as-needed basis.  Stable    10) Next Cologuard will be due December 2021    11) New carotid bruit on the left; we will order carotid ultrasound    Health maintenance: Flu shot Declined, Covid 19 vaccine Declined, Shingrix series advised declines tetanus shot today      Fall risk negligable: the patient was evaluated for risk for falls and determined not to be at risk.  Patient does not feel they are a fall risk, and timed (20 seconds) get up and go test was either negative, or the patient was observed to walk without difficulty.     Patient declines to sign up for CCM Revup     Patient declines to schedule annual Medicare Wellness Visit with Leontine Locket     Follow-up 6 months      40981: 40-54 minutes spent with patient              ---------------------------------------------------------------------------------------------------------   The Progress  note for today is completed above in a SOAP note format. The patients allergies,  med list, and Problem Summary List are recorded below, as well as a list of helpful smart phrases.  ---------------------------------------------------------------------------------------------------------      For those patients followed by me, please go to the problem summary list and look under 'Overview' "Dr. Burnadette Pop Health Maintenance/Addendums/Outside of [facility] & other Studies/Fhx/Shx/Vaccinations/Surgeries" which was reviewed and updated (if indicated) this date    Smart Phrases starting with "JWT":   *ADD           *colic                 *HTNmeds      *NSAIDS                 *PSA  *Asthma       *constipation     *laceration       *peripheraledema   *truancy  *BRCA         *dementia         *naturalmeds   *polycythemia    No Known Allergies    Current Outpatient Medications on File Prior to Visit   Medication Sig Dispense Refill   . amLODIPine (NORVASC) 5 MG tablet Take 1 tablet (5 mg total) by mouth daily 90 tablet 1   . apixaban (Eliquis) 5 MG Take 1 tablet (5 mg total) by mouth every 12 (twelve) hours 180 tablet 3   . atorvastatin (LIPITOR) 40 MG tablet Take 1 tablet (40 mg total) by mouth daily 90 tablet 1   . cyanocobalamin 1000 MCG tablet Take 1,000 mcg by mouth 2 (two) times daily        . doxazosin (CARDURA) 8 MG tablet TAKE 1 TABLET(8 MG) BY MOUTH EVERY NIGHT 90 tablet 1   . omeprazole (PriLOSEC) 20 MG capsule TAKE 1 CAPSULE BY MOUTH DAILY 90 capsule 0   . triamcinolone (KENALOG) 0.1 % cream Apply topically 2 (two) times daily 30 g 1   . [DISCONTINUED] b complex vitamins tablet Take 1 tablet by  mouth daily       No current facility-administered medications on file prior to visit.       Patient Active Problem List   Diagnosis   . Pure hypercholesterolemia   . CPK elevation, chronic   . Esophageal reflux s/p EGD 5/06   . Headache(784.0)   . HTN   . Lumbago   . Anemia, neg w/u in past   . Dr. Burnadette Pop Health Maintenance/Addendums/Outside of [facility] & other  Studies/Fhx/Shx/Vaccinations/Surgeries   . Non morbid obesity   . Aortic insufficiency-4/13 echo   . Pulmonary embolism 1/18   . Peripheral arterial disease   . Vitiligo       RESULTRCNT(52w)    This note was completed using Engineer, production.  Grammatical errors, random word insertions, pronoun errors, incomplete sentences, and other errors are possible due to software limitations.  It is possible that I may have missed such errors when carefully reviewing the note. If the reader has questions or concerns regarding these errors or this note, please address them with me for clarification.     This note was electronically signed by Darlina Sicilian, MD

## 2020-09-19 NOTE — Telephone Encounter (Signed)
FAXED TO [facility]

## 2020-10-08 ENCOUNTER — Telehealth (RURAL_HEALTH_CENTER): Payer: Self-pay | Admitting: Family Medicine

## 2020-10-08 NOTE — Telephone Encounter (Signed)
Please call pt with the following lab results and instructions:    Carotid ultrasound with no significant blockages; no change ---JWT    Please do not discontinue or change any of your medications unless instructed to do so. Keep your next scheduled visit.    Darlina Sicilian, MD

## 2020-10-10 ENCOUNTER — Other Ambulatory Visit (RURAL_HEALTH_CENTER): Payer: Self-pay

## 2020-10-10 DIAGNOSIS — R0989 Other specified symptoms and signs involving the circulatory and respiratory systems: Secondary | ICD-10-CM

## 2020-10-19 ENCOUNTER — Telehealth (RURAL_HEALTH_CENTER): Payer: Self-pay | Admitting: Family Medicine

## 2020-10-19 DIAGNOSIS — Z0000 Encounter for general adult medical examination without abnormal findings: Secondary | ICD-10-CM

## 2021-01-06 ENCOUNTER — Ambulatory Visit (RURAL_HEALTH_CENTER): Payer: Self-pay | Admitting: Family Medicine

## 2021-01-10 NOTE — Telephone Encounter (Signed)
Entered in error

## 2021-02-09 ENCOUNTER — Other Ambulatory Visit (RURAL_HEALTH_CENTER): Payer: Self-pay

## 2021-02-09 MED ORDER — TRIAMCINOLONE ACETONIDE 0.1 % EX CREA
TOPICAL_CREAM | Freq: Two times a day (BID) | CUTANEOUS | 1 refills | Status: DC
Start: 2021-02-09 — End: 2021-02-15

## 2021-02-15 ENCOUNTER — Other Ambulatory Visit (RURAL_HEALTH_CENTER): Payer: Self-pay

## 2021-02-15 MED ORDER — TRIAMCINOLONE ACETONIDE 0.1 % EX CREA
TOPICAL_CREAM | Freq: Two times a day (BID) | CUTANEOUS | 1 refills | Status: DC
Start: ? — End: 2021-02-15

## 2021-02-15 NOTE — Telephone Encounter (Signed)
Patient wants script to [facility]

## 2021-03-17 ENCOUNTER — Encounter (RURAL_HEALTH_CENTER): Payer: Self-pay | Admitting: Family Medicine

## 2021-03-17 ENCOUNTER — Other Ambulatory Visit
Admission: RE | Admit: 2021-03-17 | Discharge: 2021-03-17 | Disposition: A | Discharge status: Home / Self Care | Payer: No Typology Code available for payment source | Source: Ambulatory Visit | Attending: Family Medicine | Admitting: Family Medicine

## 2021-03-17 ENCOUNTER — Ambulatory Visit: Payer: No Typology Code available for payment source | Attending: Family Medicine | Admitting: Family Medicine

## 2021-03-17 VITALS — BP 120/76 | HR 71 | Temp 97.8°F | Resp 16 | Ht 67.0 in | Wt 215.8 lb

## 2021-03-17 DIAGNOSIS — E669 Obesity, unspecified: Secondary | ICD-10-CM

## 2021-03-17 DIAGNOSIS — E7800 Pure hypercholesterolemia, unspecified: Secondary | ICD-10-CM

## 2021-03-17 DIAGNOSIS — T148XXA Other injury of unspecified body region, initial encounter: Secondary | ICD-10-CM

## 2021-03-17 DIAGNOSIS — I10 Essential (primary) hypertension: Secondary | ICD-10-CM

## 2021-03-17 DIAGNOSIS — I739 Peripheral vascular disease, unspecified: Secondary | ICD-10-CM

## 2021-03-17 DIAGNOSIS — I2699 Other pulmonary embolism without acute cor pulmonale: Secondary | ICD-10-CM

## 2021-03-17 DIAGNOSIS — Z23 Encounter for immunization: Secondary | ICD-10-CM

## 2021-03-17 DIAGNOSIS — Z0000 Encounter for general adult medical examination without abnormal findings: Secondary | ICD-10-CM

## 2021-03-17 DIAGNOSIS — Z1211 Encounter for screening for malignant neoplasm of colon: Secondary | ICD-10-CM

## 2021-03-17 LAB — CBC AND DIFFERENTIAL
Basophils %: 0.7 % (ref 0.0–3.0)
Basophils Absolute: 0 10*3/uL (ref 0.0–0.3)
Eosinophils %: 5.8 % (ref 0.0–7.0)
Eosinophils Absolute: 0.3 10*3/uL (ref 0.0–0.8)
Hematocrit: 38.8 % — ABNORMAL LOW (ref 39.0–52.5)
Hemoglobin: 12.6 gm/dL — ABNORMAL LOW (ref 13.0–17.5)
Lymphocytes Absolute: 1 10*3/uL (ref 0.6–5.1)
Lymphocytes: 17.1 % (ref 15.0–46.0)
MCH: 28 pg (ref 28–35)
MCHC: 32 gm/dL (ref 32–36)
MCV: 86 fL (ref 80–100)
MPV: 10.5 fL — ABNORMAL HIGH (ref 6.0–10.0)
Monocytes Absolute: 0.8 10*3/uL (ref 0.1–1.7)
Monocytes: 13.6 % (ref 3.0–15.0)
Neutrophils %: 62.8 % (ref 42.0–78.0)
Neutrophils Absolute: 3.6 10*3/uL (ref 1.7–8.6)
PLT CT: 153 10*3/uL (ref 130–440)
RBC: 4.53 10*6/uL (ref 4.00–5.70)
RDW: 13.9 % (ref 11.0–14.0)
WBC: 5.8 10*3/uL (ref 4.0–11.0)

## 2021-03-17 LAB — LIPD PANEL WITH NON-HDL CHOLESTEROL, SERUM
Cholesterol: 116 mg/dL (ref 75–199)
Coronary Heart Disease Risk: 2.32
HDL: 50 mg/dL (ref 40–55)
LDL Calculated: 57 mg/dL
NON HDL CHOLESTEROL: 66 mg/dL
Triglycerides: 46 mg/dL (ref 10–150)
VLDL: 9 (ref 0–40)

## 2021-03-17 LAB — BASIC METABOLIC PANEL
Anion Gap: 12.2 mMol/L (ref 7.0–18.0)
BUN / Creatinine Ratio: 12 Ratio (ref 10.0–30.0)
BUN: 13 mg/dL (ref 7–22)
CO2: 24 mMol/L (ref 20–30)
Calcium: 9.4 mg/dL (ref 8.5–10.5)
Chloride: 108 mMol/L (ref 98–110)
Creatinine: 1.08 mg/dL (ref 0.80–1.30)
EGFR: 69 mL/min/{1.73_m2} (ref 60–150)
Glucose: 87 mg/dL (ref 71–99)
Osmolality Calculated: 279 mOsm/kg (ref 275–300)
Potassium: 4.2 mMol/L (ref 3.5–5.3)
Sodium: 140 mMol/L (ref 136–147)

## 2021-03-17 MED ORDER — OMEPRAZOLE 20 MG PO CPDR
DELAYED_RELEASE_CAPSULE | ORAL | 0 refills | Status: DC
Start: 2021-03-17 — End: 2021-06-25

## 2021-03-17 MED ORDER — ATORVASTATIN CALCIUM 40 MG PO TABS
40.0000 mg | ORAL_TABLET | Freq: Every day | ORAL | 1 refills | Status: DC
Start: ? — End: 2021-03-17

## 2021-03-17 MED ORDER — AMLODIPINE BESYLATE 5 MG PO TABS
5.0000 mg | ORAL_TABLET | Freq: Every day | ORAL | 1 refills | Status: DC
Start: ? — End: 2021-03-17

## 2021-03-17 MED ORDER — APIXABAN 5 MG PO TABS
5.0000 mg | ORAL_TABLET | Freq: Two times a day (BID) | ORAL | 3 refills | Status: DC
Start: ? — End: 2021-03-17

## 2021-03-17 MED ORDER — DOXAZOSIN MESYLATE 8 MG PO TABS
ORAL_TABLET | ORAL | 1 refills | Status: DC
Start: ? — End: 2021-03-17

## 2021-03-17 NOTE — Progress Notes (Signed)
Date Specimen Drawn:  03/17/2021   Time Specimen Drawn:  9:40 AM   Test(s) Ordered:  Bmp, cbc, lipid   Disposition:  n/a   Patient's Tolerance:  Good   Location Specimen Drawn:  Right antecubital

## 2021-03-17 NOTE — Addendum Note (Signed)
Addended by: Marshall Cork on: 03/17/2021 09:38 AM     Modules accepted: Orders

## 2021-03-17 NOTE — Progress Notes (Signed)
Progress Note: 03/17/2021  The top chronic medical problems (but not the complete list) addressed on              (03/17/2021 ) as a Semi-core note: Core note status:  Last Core Note 09/16/2020, next Core f/u due 09/16/21.        Noah Ali,   Date of birth: 04-22-1951, 70 y.o., male     Todays labs were drawn while fasting, goal non HDL < 130 and is on a statin             Addendums: if there any, they can be found in the Problem Summary List under "Dr. Burnadette Pop Health Maintenance/Addendums/Outside of [facility] & other Studies/Fhx/Shx/Vaccinations/Surgeries"    BP 120/76    Pulse 71    Temp 97.8 F (36.6 C)    Resp 16    Ht 1.702 m (5\' 7" )    Wt 97.9 kg (215 lb 12.8 oz)    SpO2 97%    BMI 33.80 kg/m        Chief Complaint: Pt here for Chronic medical problems followup:    Subjective:   This 70 y.o. male presents for evaluation of the following medical condition(s):    -5 minutes chart review spent in preparation for this note     1) Hypertension on the medications listed with good compliance.Pt is monitoring home blood pressures with most <140/90. Most office blood pressure readings are <140/90.   BP Readings from Last 3 Encounters:   03/17/21 120/76   09/16/20 120/70   03/18/20 136/80       In review: Pt experienced acute kidney injury with peak creatinine at 1.76 during admission 1/18. ACE inhibitor and HCTZ withheld with renal function returning to normal. Discharge creatinine 1.38 with most recent creatinine1.12 4/21    2) Hyperlipidemia on Lipitor 40 mg withrisk of MI/CVA for 10 years based on cholesterol risk calculator was 11%and he does have peripheral arterial disease as well. He has had excellentlipid profiles consecutively since on Lipitor.     3) GERD s/p EGD 5/06 on prn prilosec and under good control; averages 1-3pills/week. 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 Other tests for hypercoagulation came back negative including factor V Leiden. His dose of Eliquis was dropped down to 2.5 mg twice daily when his renal function dropped, but now that he is back to normal, his Eliquis is back up to 5 mg twice daily. PEfound based on CT angiogram chest. Duplex ultrasound of lower extremities came back normal/negative. There still is a little residual flank area discomfort on the right    Denies shortness of breath    5) Chronic pain issues:  -LBP that is recurrent. Doing okay at this time  -Left knee painstatus post arthroscopyand patient was told by his orthopedic surgeon that he would probably need to have a total right replacement.   Followed by Ortho on an as-needed basis    6) Non morbid obesity and not on a diet; we have advised low carb diet. The most he has ever weighed is 233lbs  Wt Readings from Last 3 Encounters:   03/17/21 97.9 kg (215 lb 12.8 oz)   09/16/20 96.2 kg (  212 lb)   03/18/20 94.3 kg (207 lb 12.8 oz)       7) PAD based on CT scan showing mild aortic calcification January 2018. Goal LDL less than 100  ----------------------    8) Next Cologuard is due now    9) Periodic abrasions and overdue for tetanus shot    Hepatitis C screen 3/18 negative,and Cologuard negative 12/18    Review of Systems.   There is a history of PAD    Denies chest pain, shortness of breath, side effects, or past medical history of CAD, CVA    OBJECTIVE:   -Overweight Pt in NAD   -Neck: supple, soft bruit on the left side heard for the first time 09/16/2020   -Respiratory: CTA   -Cardiovascular: RRR with 1/6 SEM precordial area w/o gallops (known history aortic insufficiency)-Abdomen: soft and nontender   -Extremities: Without edema     Assessment/Plan: Labs were drawn while  fasting.   1) Hypertension under good control with goal blood pressure less than 140/90  Labs today and follow-up 6 months    2) Hyperlipidemia with labs today and goal LDL less than 100    3) GERD s/p EGD 5/06 on prn prilosec and under good control; averages 1-3pills/week. He is also taking a B12 vitamin.  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 No change    5) Chronic pain issues:  Tylenol around the clock (appropriate dosing discussed and not within 8 hours of other tylenol containing products)  SAM-E  Topical diclofenac gel  Ice treatments (consider gel pack in pouch, apply up to 20 minutes TID)       6) Non morbid obesity  Advised low-carb diet, weight watchers or other reputable diet.  Exercise equivalent to a brisk walk for 30 to 60 minutes a day    7) PAD based on CT scan showing mild aortic calcification January 2018. Goal LDL less than 100 with labs today    8) Next Cologuard is due now and ordered today    9) Periodic abrasions and overdue for tetanus shot with tetanus shot ordered today    Health maintenance: Flu shot Advised for October 2022, Covid 19 vaccine Advised , Shingrix series advised at a local pharmacy      Fall risk negligable: the patient was evaluated for risk for falls and determined not to be at risk.  Patient does not feel they are a fall risk, and timed (20 seconds) get up and go test was either negative, or the patient was observed to walk without difficulty.     Patient declines to sign up for CCM Revup     Follow-up 6 months       ---------------------------------------------------------------------------------------------------------   The Progress  note for today is completed above in a SOAP note format. The patients allergies, med list, and Problem Summary List are recorded below, as well as a list of helpful smart  phrases.  ---------------------------------------------------------------------------------------------------------      For those patients followed by me, please go to the problem summary list and look under 'Overview' "Dr. Burnadette Pop Health Maintenance/Addendums/Outside of [facility] & other Studies/Fhx/Shx/Vaccinations/Surgeries" which was reviewed and updated (if indicated) this date    Smart Phrases starting with "JWT":   *ADD           *colic                 *  HTNmeds      *NSAIDS                 *PSA  *Asthma       *constipation     *laceration       *peripheraledema   *truancy  *BRCA         *dementia         *naturalmeds   *polycythemia    No Known Allergies    Current Outpatient Medications on File Prior to Visit   Medication Sig Dispense Refill    amLODIPine (NORVASC) 5 MG tablet Take 1 tablet (5 mg total) by mouth daily 90 tablet 1    apixaban (Eliquis) 5 MG Take 1 tablet (5 mg total) by mouth every 12 (twelve) hours 180 tablet 3    atorvastatin (LIPITOR) 40 MG tablet Take 1 tablet (40 mg total) by mouth daily 90 tablet 1    cyanocobalamin 1000 MCG tablet Take 1,000 mcg by mouth 2 (two) times daily         doxazosin (CARDURA) 8 MG tablet TAKE 1 TABLET(8 MG) BY MOUTH EVERY NIGHT 90 tablet 1    omeprazole (PriLOSEC) 20 MG capsule TAKE 1 CAPSULE BY MOUTH DAILY 90 capsule 0    triamcinolone (KENALOG) 0.1 % cream Apply topically 2 (two) times daily 30 g 1     No current facility-administered medications on file prior to visit.       Patient Active Problem List   Diagnosis    Pure hypercholesterolemia    CPK elevation, chronic    Esophageal reflux s/p EGD 5/06    Headache(784.0)    HTN    Lumbago    Anemia, neg w/u in past    Dr. Burnadette Pop Health Maintenance/Addendums/Outside of [facility] & other Studies/Fhx/Shx/Vaccinations/Surgeries    Non morbid obesity    Aortic insufficiency-4/13 echo    Pulmonary embolism 1/18    Peripheral arterial disease    Vitiligo        RESULTRCNT(52w)    This note was completed using Dragon medical speech/voice recognition technology.  Grammatical errors, random word insertions, pronoun errors, incomplete sentences, and other errors are possible due to software limitations.  It is possible that I may have missed such errors when carefully reviewing the note. If the reader has questions or concerns regarding these errors or this note, please address them with me for clarification.     This note was electronically signed by Darlina Sicilian, MD

## 2021-03-29 LAB — VH COLOGUARD: Cologuard: NEGATIVE

## 2021-06-24 ENCOUNTER — Other Ambulatory Visit (RURAL_HEALTH_CENTER): Payer: Self-pay | Admitting: Family Medicine

## 2021-07-14 ENCOUNTER — Ambulatory Visit: Payer: No Typology Code available for payment source | Attending: Family | Admitting: Family

## 2021-07-14 ENCOUNTER — Encounter (RURAL_HEALTH_CENTER): Payer: Self-pay | Admitting: Family

## 2021-07-14 ENCOUNTER — Ambulatory Visit
Admission: RE | Admit: 2021-07-14 | Discharge: 2021-07-14 | Disposition: A | Discharge status: Home / Self Care | Payer: No Typology Code available for payment source | Source: Ambulatory Visit | Attending: Family | Admitting: Family

## 2021-07-14 VITALS — BP 160/90 | HR 86 | Temp 97.7°F | Resp 18 | Ht 67.0 in | Wt 212.8 lb

## 2021-07-14 DIAGNOSIS — M545 Low back pain, unspecified: Secondary | ICD-10-CM

## 2021-07-14 DIAGNOSIS — M47816 Spondylosis without myelopathy or radiculopathy, lumbar region: Secondary | ICD-10-CM | POA: Insufficient documentation

## 2021-07-14 NOTE — Progress Notes (Signed)
Subjective:    Patient ID: Noah Ali is a 70 y.o. male.    HPI    Back pain for 2 weeks. Intermittent. Taking over the counter medication. Not sure what. Helps some.     Worse after sitting and he gets back up. Also in the am. Aching. Does not radiate to legs. No leg weakness.     Still working, runs equipment and concrete work.         The following portions of the patient's history were reviewed and updated as appropriate: allergies, current medications, past family history, past medical history, past social history, past surgical history, and problem list.    Review of Systems   Gastrointestinal: Negative.    Genitourinary: Negative.    Psychiatric/Behavioral:  The patient is not nervous/anxious.          Objective:    Physical Exam  Constitutional:       General: He is not in acute distress.     Appearance: Normal appearance. He is not ill-appearing or toxic-appearing.   Musculoskeletal:         General: No tenderness.      Comments: Worse with lateral flexion, no tenderness over spine or paravertebral muscles.    Skin:     General: Skin is warm and dry.   Neurological:      General: No focal deficit present.      Mental Status: He is alert and oriented to person, place, and time.      Motor: No weakness.   Psychiatric:         Mood and Affect: Mood normal.         Behavior: Behavior normal.     BP 160/90   Pulse 86   Temp 97.7 F (36.5 C)   Resp 18   Ht 1.702 m (5\' 7" )   Wt 96.5 kg (212 lb 12.8 oz)   SpO2 98%   BMI 33.33 kg/m        Assessment:       1. Acute left-sided low back pain without sciatica          Plan:       Stiffness in the morning and after prolonged sitting points towards arthritis.  No radiation to extremities.  Consider NSAID but he is on Eliquis.  Therefore recommend Tylenol 1000 mg 3 times a day.  We will go ahead and get an x-ray given his age.  Advised the next step to consider would be referral to Ortho and/or physical therapy.

## 2021-09-15 ENCOUNTER — Other Ambulatory Visit (RURAL_HEALTH_CENTER): Payer: Self-pay | Admitting: Family Medicine

## 2021-09-29 ENCOUNTER — Ambulatory Visit (RURAL_HEALTH_CENTER): Payer: Self-pay | Admitting: Family Medicine

## 2021-10-13 ENCOUNTER — Ambulatory Visit: Payer: No Typology Code available for payment source | Attending: Family Medicine | Admitting: Family Medicine

## 2021-10-13 ENCOUNTER — Encounter (RURAL_HEALTH_CENTER): Payer: Self-pay | Admitting: Family Medicine

## 2021-10-13 ENCOUNTER — Other Ambulatory Visit
Admission: RE | Admit: 2021-10-13 | Discharge: 2021-10-13 | Disposition: A | Discharge status: Home / Self Care | Payer: No Typology Code available for payment source | Source: Ambulatory Visit | Attending: Family Medicine | Admitting: Family Medicine

## 2021-10-13 VITALS — BP 140/86 | HR 87 | Temp 97.8°F | Resp 18 | Ht 67.0 in | Wt 215.1 lb

## 2021-10-13 DIAGNOSIS — I739 Peripheral vascular disease, unspecified: Secondary | ICD-10-CM

## 2021-10-13 DIAGNOSIS — E7800 Pure hypercholesterolemia, unspecified: Secondary | ICD-10-CM

## 2021-10-13 DIAGNOSIS — I10 Essential (primary) hypertension: Secondary | ICD-10-CM

## 2021-10-13 DIAGNOSIS — I2699 Other pulmonary embolism without acute cor pulmonale: Secondary | ICD-10-CM

## 2021-10-13 DIAGNOSIS — E669 Obesity, unspecified: Secondary | ICD-10-CM

## 2021-10-13 DIAGNOSIS — K219 Gastro-esophageal reflux disease without esophagitis: Secondary | ICD-10-CM

## 2021-10-13 DIAGNOSIS — L80 Vitiligo: Secondary | ICD-10-CM

## 2021-10-13 DIAGNOSIS — I351 Nonrheumatic aortic (valve) insufficiency: Secondary | ICD-10-CM

## 2021-10-13 DIAGNOSIS — Z0000 Encounter for general adult medical examination without abnormal findings: Secondary | ICD-10-CM

## 2021-10-13 NOTE — Progress Notes (Signed)
Date Specimen Drawn:  10/13/2021   Time Specimen Drawn:  4:29 PM   Test(s) Ordered:  Bmp, cbc, lipid   Disposition:  n/a   Patient's Tolerance:  Good   Location Specimen Drawn:  Right antecubital

## 2021-10-13 NOTE — Progress Notes (Signed)
Progress Note: 10/13/2021     Last Core Note 10/13/2021, next Core f/u due 10/13/22.  Additionally, F/U 6 months (04/12/22 ) for a semi-core note                   Noah Ali,   Date of birth: Nov 27, 1950, 70 y.o., male     Todays labs were drawn while nonfasting, goal non HDL < 130, and is on a statin             Addendums: if there any, they can be found in the Problem Summary List under "Dr. Burnadette Pop Health Maintenance/Addendums/Outside of [facility] & other Studies/Fhx/Shx/Vaccinations/Surgeries"    BP 140/86    Pulse 87    Temp 97.8 F (36.6 C)    Resp 18    Ht 1.702 m (5\' 7" )    Wt 97.6 kg (215 lb 1.9 oz)    SpO2 98%    BMI 33.69 kg/m          Chief Complaint: Pt here for Chronic medical problems followup:    Subjective:   This 70 y.o. male presents for evaluation of the following medical condition(s):    Chart review dating back to patient's last core note 09/16/2020.  -16 minutes chart review spent in preparation for this note    1) Hypertension on the medications listed with good compliance. Pt is monitoring home blood pressures with most < 140/90.  Most office blood pressure readings are <140/90.    BP Readings from Last 3 Encounters:   10/13/21 140/86   07/14/21 160/90   03/17/21 120/76         In review: Pt experienced acute kidney injury with peak creatinine at 1.76 during admission 1/18. ACE inhibitor and HCTZ withheld with renal function returning to normal. Discharge creatinine 1.38 with most recent creatinine 1.08 Consistent with GFR 69     2) Hyperlipidemia on Lipitor 40 mg with risk of MI/CVA for 10 years based on cholesterol risk calculator was 11% and he does have peripheral arterial disease as well. He has had excellent lipid profiles consecutively since on Lipitor.      3) GERD s/p EGD 5/06 on prn prilosec and under good control; averages  still 1-3 pills/week.  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 Other tests for hypercoagulation came back negative including factor V Leiden.  His dose of Eliquis was dropped down to 2.5 mg twice daily when his renal function dropped, but now that he is back to normal, his Eliquis is back up to 5 mg twice daily.  PE found based on CT angiogram chest.  Duplex ultrasound of lower extremities came back normal/negative.  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  Most recent flare August 2022 for which she was seen by Marylu Lund on 07/14/2021.  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    Followed by Ortho on an as-needed basis     6) Non morbid obesity and not on a diet; we have advised low carb diet. The most he has ever weighed is 233 lbs.    Wt Readings from Last 3 Encounters:   10/13/21 97.6 kg (215 lb 1.9 oz)   07/14/21 96.5 kg (212 lb 12.8 oz)   03/17/21 97.9 kg (215 lb 12.8 oz)           7) PAD based on:  - CT scan showing mild aortic calcification January 2018.  Goal LDL less than 100    -Carotid ultrasound with less than 50% blockage internal carotid arteries but bilateral mild plaquing with test ordered due to carotid bruit    ----------------------      8) Mild aortic insufficiency and mild mitral regurge based on echocardiograms 2013 and 9/17 with essentially no change on 10/20 and 11/21 echoes with tickler in place for for 11/24       9) Mild, intermittent anemia with neg work-up in the past.  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  He was followed by dermatology and was on tacrolimus in the past, but currently triamcinolone on an as-needed basis.  Stable    11) Renal issues:      -Renal cyst on the right found on CT at 12.1 by 12.1 cm size with small amount of wall calcification. Bilateral non-worrisome renal cysts were found as well measuring up to 2.3 cm in size.  Patient underwent a laparoscopic right renal cystectomy with benign path found.  Most recently seen by [facility] urology 10/22 with 10/06/2021 renal ultrasound with no significant abnormalities found    -Renal lithiasis followed by [facility] urology    Medical problems that are no longer active but could recur.  We are keeping these problems listed for reference should they recur/flair:      12) Chronic CPK elevations, mild with normal values 3 since December 2014. We are no longer checking CPK's     Denies myalgias or arthralgias. CPK last visit was normal, although mildly elevated the time before     13) Acute kidney injury with peak creatinine at 1.76 during admission 1/18. ACE inhibitor and HCTZ withheld with renal function returning to normal.  He was placed on Norvasc and subsequently Cardura to replace these other medicines which he remains on.      14) Left olecranon bursitis for which the patient was seen in the [facility] emergency room 11/14/2018 with normal/negative x-ray other than fluid in the bursa area.  This has resolved     15) Decreased albumin/globulin ratio 1/18 admission with f/u SIEP nl/neg      16) Former smoker who quit around age 54 and had a normal abdominal aorta on CT 1/18 of his abdomen.     5/22 Cologuard negative; next study due in 3 years     Hepatitis C screen 3/18 negative, and Cologuard negative 12/18    Review of Systems.   There is a history of PAD    Denies chest pain, shortness of breath, side effects, or past medical history of CAD, CVA      OBJECTIVE:    -Overweight Pt in NAD    -Neck: supple, soft bruit  bilaterally with carotid ultrasound showing insignificant PAD  -Respiratory: CTA    -Cardiovascular: RRR with 1/6 SEM precordial area w/o gallops    -Abdomen: soft and nontender    -Extremities: Without edema      Assessment/Plan:  1) Hypertension under good control with goal blood pressure less than 140/90  Labs today and follow-up 6 months    2) Hyperlipidemia with labs today and follow-up 6 months    3) GERD s/p EGD 5/06 good control on maybe 1 to 3 pills of Prilosec per week.  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      5)  Chronic pain issues:   Tylenol around the clock (appropriate dosing discussed and not within 8 hours of other tylenol containing products)  SAM-E  Topical diclofenac gel  Ice treatments (consider gel pack in pouch, apply up to 20 minutes TID)        6) Non morbid obesity   Advise low-carb diet, weight watchers or other reputable diet.  Exercise equivalent to at least a brisk walk for 30 to 60 minutes a day        7) PAD goal LDL less than 100      8) Mild aortic insufficiency and mild mitral regurge based on echocardiograms 2013 and 9/17 with essentially no change on 10/20 and 11/21 echoes with tickler in place for for 11/24    9) Mild, intermittent anemia labs today    Health maintenance: Flu shot declined, Covid 19 vaccine declined, Hep B Vaccine (19-59) not indicated, Shingrix advised      Fall risk negligable: the patient was evaluated for risk for falls and determined not to be at risk.  Patient does not feel they are a fall risk, and timed (20 seconds) get up and go test was either negative, or the patient was observed to walk without difficulty.     Patient declines to sign up for CCM Revup     Follow-up 6 months       ---------------------------------------------------------------------------------------------------------    The Progress  note for today is completed above in a SOAP note format. The patients allergies, med list, and Problem Summary List are recorded below, as well as a list of helpful smart phrases.  ---------------------------------------------------------------------------------------------------------      For those patients followed by me, please go to the problem summary list and look under 'Overview' "Dr. Burnadette Pop Health Maintenance/Addendums/Outside of [facility] & other Studies/Fhx/Shx/Vaccinations/Surgeries" which was reviewed and updated (if indicated) this date    Smart Phrases starting with "JWT":   *ADD           *colic                 *HTNmeds      *NSAIDS                 *PSA  *Asthma       *constipation     *laceration       *peripheraledema   *truancy  *BRCA         *dementia         *naturalmeds   *polycythemia    No Known Allergies    Current Outpatient Medications on File Prior to Visit   Medication Sig Dispense Refill    amLODIPine (NORVASC) 5 MG tablet Take 1 tablet by mouth once daily 90 tablet 0    apixaban (Eliquis)  5 MG Take 1 tablet (5 mg total) by mouth every 12 (twelve) hours 180 tablet 3    atorvastatin (LIPITOR) 40 MG tablet Take 1 tablet by mouth once daily 90 tablet 0    cyanocobalamin 1000 MCG tablet Take 1,000 mcg by mouth 2 (two) times daily         doxazosin (CARDURA) 8 MG tablet TAKE 1 TABLET BY MOUTH ONCE DAILY AT NIGHT 90 tablet 0    omeprazole (PriLOSEC) 20 MG capsule Take 1 capsule by mouth once daily 90 capsule 0    triamcinolone (KENALOG) 0.1 % cream Apply topically 2 (two) times daily 30 g 1     No current facility-administered medications on file prior to visit.       Patient Active Problem List   Diagnosis    Pure hypercholesterolemia    CPK elevation, chronic    Esophageal reflux s/p EGD 5/06    Headache(784.0)    HTN    Lumbago    Anemia, neg w/u in past    Dr. Burnadette Pop Health Maintenance/Addendums/Outside of [facility] & other  Studies/Fhx/Shx/Vaccinations/Surgeries    Non morbid obesity    Aortic insufficiency-4/13 echo    Pulmonary embolism 1/18    Peripheral arterial disease    Vitiligo       RESULTRCNT(52w)    This note was completed using Dragon medical speech/voice recognition technology.  Grammatical errors, random word insertions, pronoun errors, incomplete sentences, and other errors are possible due to software limitations.  It is possible that I may have missed such errors when carefully reviewing the note. If the reader has questions or concerns regarding these errors or this note, please address them with me for clarification.     This note was electronically signed by Darlina Sicilian, MD

## 2021-10-14 LAB — LIPD PANEL WITH NON-HDL CHOLESTEROL, SERUM
Cholesterol: 106 mg/dL (ref 75–199)
Coronary Heart Disease Risk: 2.3
HDL: 46 mg/dL (ref 40–55)
LDL Calculated: 44 mg/dL
NON HDL CHOLESTEROL: 60 mg/dL
Triglycerides: 79 mg/dL (ref 10–150)
VLDL: 16 (ref 0–40)

## 2021-10-14 LAB — CBC AND DIFFERENTIAL
Basophils %: 0.3 % (ref 0.0–3.0)
Basophils Absolute: 0 10*3/uL (ref 0.0–0.3)
Eosinophils %: 4.2 % (ref 0.0–7.0)
Eosinophils Absolute: 0.2 10*3/uL (ref 0.0–0.8)
Hematocrit: 36.2 % — ABNORMAL LOW (ref 39.0–52.5)
Hemoglobin: 11.5 gm/dL — ABNORMAL LOW (ref 13.0–17.5)
Lymphocytes Absolute: 1 10*3/uL (ref 0.6–5.1)
Lymphocytes: 17.6 % (ref 15.0–46.0)
MCH: 28 pg (ref 28–35)
MCHC: 32 gm/dL (ref 32–36)
MCV: 88 fL (ref 80–100)
MPV: 10.4 fL — ABNORMAL HIGH (ref 6.0–10.0)
Monocytes Absolute: 0.5 10*3/uL (ref 0.1–1.7)
Monocytes: 9.3 % (ref 3.0–15.0)
Neutrophils %: 68.7 % (ref 42.0–78.0)
Neutrophils Absolute: 3.8 10*3/uL (ref 1.7–8.6)
PLT CT: 164 10*3/uL (ref 130–440)
RBC: 4.11 10*6/uL (ref 4.00–5.70)
RDW: 13.3 % (ref 11.0–14.0)
WBC: 5.6 10*3/uL (ref 4.0–11.0)

## 2021-10-14 LAB — BASIC METABOLIC PANEL
Anion Gap: 11.7 mMol/L (ref 7.0–18.0)
BUN / Creatinine Ratio: 13.8 Ratio (ref 10.0–30.0)
BUN: 16 mg/dL (ref 7–22)
CO2: 22 mMol/L (ref 20–30)
Calcium: 9 mg/dL (ref 8.5–10.5)
Chloride: 108 mMol/L (ref 98–110)
Creatinine: 1.16 mg/dL (ref 0.80–1.30)
EGFR: 67 mL/min/{1.73_m2} (ref 60–150)
Glucose: 128 mg/dL — ABNORMAL HIGH (ref 71–99)
Osmolality Calculated: 279 mOsm/kg (ref 275–300)
Potassium: 3.7 mMol/L (ref 3.5–5.3)
Sodium: 138 mMol/L (ref 136–147)

## 2021-12-20 ENCOUNTER — Other Ambulatory Visit (RURAL_HEALTH_CENTER): Payer: Self-pay | Admitting: Family Medicine

## 2022-03-30 ENCOUNTER — Ambulatory Visit (RURAL_HEALTH_CENTER): Payer: Self-pay | Admitting: Family Medicine

## 2022-04-10 ENCOUNTER — Other Ambulatory Visit (RURAL_HEALTH_CENTER): Payer: Self-pay | Admitting: Family Medicine

## 2022-04-13 ENCOUNTER — Ambulatory Visit (RURAL_HEALTH_CENTER): Payer: Self-pay | Admitting: Family Medicine

## 2022-04-27 ENCOUNTER — Ambulatory Visit: Payer: No Typology Code available for payment source | Attending: Family Medicine | Admitting: Family Medicine

## 2022-04-27 ENCOUNTER — Encounter (RURAL_HEALTH_CENTER): Payer: Self-pay | Admitting: Family Medicine

## 2022-04-27 VITALS — BP 150/80 | HR 74 | Temp 97.9°F | Resp 14 | Ht 67.0 in | Wt 215.0 lb

## 2022-04-27 DIAGNOSIS — L80 Vitiligo: Secondary | ICD-10-CM

## 2022-04-27 DIAGNOSIS — I10 Essential (primary) hypertension: Secondary | ICD-10-CM

## 2022-04-27 DIAGNOSIS — K219 Gastro-esophageal reflux disease without esophagitis: Secondary | ICD-10-CM

## 2022-04-27 DIAGNOSIS — E669 Obesity, unspecified: Secondary | ICD-10-CM

## 2022-04-27 DIAGNOSIS — I739 Peripheral vascular disease, unspecified: Secondary | ICD-10-CM

## 2022-04-27 DIAGNOSIS — I2699 Other pulmonary embolism without acute cor pulmonale: Secondary | ICD-10-CM

## 2022-04-27 DIAGNOSIS — E7800 Pure hypercholesterolemia, unspecified: Secondary | ICD-10-CM

## 2022-04-27 DIAGNOSIS — I351 Nonrheumatic aortic (valve) insufficiency: Secondary | ICD-10-CM

## 2022-04-27 MED ORDER — SPIRONOLACTONE 25 MG PO TABS
25.0000 mg | ORAL_TABLET | Freq: Every day | ORAL | 0 refills | Status: DC
Start: ? — End: 2022-04-27

## 2022-04-27 MED ORDER — OMEPRAZOLE 20 MG PO CPDR
20.0000 mg | DELAYED_RELEASE_CAPSULE | Freq: Every day | ORAL | 0 refills | Status: DC
Start: ? — End: 2022-04-27

## 2022-04-27 NOTE — Progress Notes (Signed)
Progress Note: 04/27/2022     Last Core Note 04/27/2022, next Core f/u due 10/27/22.     Follow-up 1 month due to elevated blood pressure with medication change, and will do all labs at that visit        BP 150/80   Pulse 74   Temp 97.9 F (36.6 C)   Resp 14   Ht 1.702 m (5\' 7" )   Wt 97.5 kg (215 lb)   SpO2 97%   BMI 33.67 kg/m       Noah Ali,   Date of birth: 18-Jun-1951, 71 y.o., male     Todays labs were drawn while nonfasting, goal non HDL < 130, and is on a statin             Addendums: if there any, they can be found in the Problem Summary List under "Dr. Burnadette Pop Health Maintenance/Addendums/Outside of [facility] & other Studies/Fhx/Shx/Vaccinations/Surgeries"    There were no vitals taken for this visit.       Chief Complaint: Pt here for Chronic medical problems followup:    Subjective:   This 71 y.o. male presents for evaluation of the following medical condition(s):    Chart review dating back to patient's last core note 10/13/21.  -6 minutes chart review spent in preparation for this note     1) Hypertension on the medications listed with good compliance. Pt is monitoring home blood pressures with most < 140/90.  Most office blood pressure readings are <140/90.    BP Readings from Last 3 Encounters:   10/13/21 140/86   07/14/21 160/90   03/17/21 120/76           In review: Pt experienced acute kidney injury with peak creatinine at 1.76 during admission 1/18. ACE inhibitor and HCTZ withheld with renal function returning to normal.  Recent creatinines have been excellent with most recent testing November 2022 at 1.16 consistent with GFR 67     2) Hyperlipidemia on Lipitor 40 mg with risk of MI/CVA for 10 years based on cholesterol risk calculator was 11% and he does have peripheral arterial disease as well. He has had excellent lipid profiles consecutively since on Lipitor.      3) GERD s/p EGD 5/06 on prn prilosec and under good control; averages    ~2 pills/week.  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 Other tests for hypercoagulation came back negative including factor V Leiden.  His dose of Eliquis was dropped down to 2.5 mg twice daily when his renal function dropped, but now that he is back to normal, his Eliquis is back up to 5 mg twice daily.  PE found based on CT angiogram chest.  Duplex ultrasound of lower extremities came back normal/negative.  There still is a little residual flank area discomfort on the right     Denies shortness of breath     5)  Chronic pain issues:      -LBP that is recurrent doing OK at this time 04/27/2022 .  Most recent flare August 2022 for which she was seen by Marylu Lund on 07/14/2021.  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    Followed by Ortho on an as-needed basis and stqates he is doing well 04/27/2022      6) Non morbid obesity and not on a diet; we have advised low carb diet. The most he has ever weighed is 233 lbs.    Wt Readings from Last 3 Encounters:   10/13/21 97.6 kg (215 lb 1.9 oz)   07/14/21 96.5 kg (212 lb 12.8 oz)   03/17/21 97.9 kg (215 lb 12.8 oz)             7) PAD based on:  - CT scan showing mild aortic calcification January 2018.  Goal LDL less than 100     -Carotid ultrasound with less than 50% blockage internal carotid arteries but bilateral mild plaquing with test ordered due to carotid bruit     ----------------------      8) Mild aortic insufficiency and mild mitral regurge based on echocardiograms 2013 and 9/17 with essentially no change on 10/20 and 11/21 echoes with  tickler in place for for 11/24     9) Mild, intermittent anemia with neg work-up in the past.  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  He was followed by dermatology and was on tacrolimus in the past, but currently triamcinolone on an as-needed basis.  Stable        Medical problems that are no longer active but could recur.  We are keeping these problems listed for reference should they recur/flair:     11) Renal issues:      -Renal cyst on the right found on CT at 12.1 by 12.1 cm size with small amount of wall calcification. Bilateral non-worrisome renal cysts were found as well measuring up to 2.3 cm in size.  Patient underwent a laparoscopic right renal cystectomy with benign path found.  Most recently seen by [facility] urology 10/22 with 10/06/2021 renal ultrasound with no significant abnormalities found and they released him     -Renal lithiasis followed by [facility] urology that has never been symptomatic     12) Chronic CPK elevations, mild with normal values 3 since December 2014. We are no longer checking CPK's     Denies myalgias or arthralgias. CPK last visit was normal, although mildly elevated the time before      13) Acute kidney injury with peak creatinine at 1.76 during admission 1/18. ACE inhibitor and HCTZ withheld with renal function returning to normal.  He was placed on Norvasc and subsequently Cardura to replace these other medicines which he remains on.      14) Left olecranon bursitis for which the patient was seen in the [facility] emergency room 11/14/2018 with normal/negative x-ray other than fluid in the bursa area.  This has resolved     15) Decreased albumin/globulin ratio 1/18 admission with f/u SIEP nl/neg      16) Former smoker who quit around age 47 and had a normal abdominal aorta on CT 1/18 of his abdomen.      5/22 Cologuard negative; next  study due in 3 years     Hepatitis C screen 3/18 negative, and Cologuard negative 12/18    Review  of Systems.   There is a history of PAD    Denies chest pain, shortness of breath, side effects, or past medical history of CAD, CVA     OBJECTIVE:    -Overweight Pt in NAD    -Neck: supple, soft bruit bilaterally with carotid ultrasound showing insignificant PAD  -Respiratory: CTA    -Cardiovascular: RRR with 1-2/6 SEM precordial area w/o gallops    -Abdomen: soft and nontender    -Extremities: Without edema     Repeat blood pressure by me 152/85     Assessment/Plan:  1) Hypertension with goal blood pressure less than 140/90  Continue current blood pressure medicines and we will add spironolactone 25 mg daily  Serial blood pressures and follow-up 1 month     2) Hyperlipidemia with excellent labs and next set of labs would be due in 6 months     3) GERD s/p EGD 5/06 on prn prilosec and under good control; averages    2 pills/week.  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      5)  Chronic pain issues that are stable at this time   Tylenol around the clock (appropriate dosing discussed and not within 8 hours of other tylenol containing products)  SAM-E  Topical diclofenac gel  Ice treatments (consider gel pack in pouch, apply up to 20 minutes TID)       6) Non morbid obesity   Advise low-carb diet, weight watchers or other reputable diet.  Exercise equivalent to a brisk walk for 30 to 60 minutes a day     7) PAD goal LDL less than 100      8) Mild aortic insufficiency - tickler in place for for next echo 11/24     9) Mild, intermittent anemia with neg work-up in the past.   We will do lab testing once yearly     10) Vitiligo stable    Health maintenance: Flu shot advised for this coming October, Covid 19 vaccine series declined, Hep B Vaccine (19-59) not indicated, Shingrix series advised      Fall risk  negligable: the patient was evaluated for risk for falls and determined not to be at risk.  Patient does not feel they are a fall risk, and timed (20 seconds) get up and go test was either negative, or the patient was observed to walk without difficulty.     Patient declines to sign up for CCM Revup     Patient declines to schedule annual Medicare Wellness Visit with Damita Dunnings     Follow-up 6 months       ---------------------------------------------------------------------------------------------------------   The Progress  note for today is completed above in a SOAP note format. The patients allergies, med list, and Problem Summary List are recorded below, as well as a list of helpful smart phrases.  ---------------------------------------------------------------------------------------------------------      For those patients followed by me, please go to the problem summary list and look under 'Overview' "Dr. Burnadette Pop Health Maintenance/Addendums/Outside of [facility] & other Studies/Fhx/Shx/Vaccinations/Surgeries" which was reviewed and updated (if indicated) this date    Smart Phrases starting with "JWT":   *ADD           *colic                 *HTNmeds      *NSAIDS                 *  PSA  *Asthma       *constipation     *laceration       *peripheraledema   *truancy  *BRCA         *dementia         *naturalmeds   *polycythemia    No Known Allergies    Current Outpatient Medications on File Prior to Visit   Medication Sig Dispense Refill    amLODIPine (NORVASC) 5 MG tablet Take 1 tablet by mouth once daily 90 tablet 1    atorvastatin (LIPITOR) 40 MG tablet Take 1 tablet by mouth once daily 90 tablet 1    cyanocobalamin 1000 MCG tablet Take 1 tablet (1,000 mcg) by mouth 2 (two) times daily      doxazosin (CARDURA) 8 MG tablet TAKE 1 TABLET BY MOUTH ONCE DAILY AT NIGHT 90 tablet 1    Eliquis 5 MG TAKE 1 TABLET BY MOUTH EVERY 12 HOURS 180 tablet 0    omeprazole (PriLOSEC) 20 MG capsule Take 1 capsule by  mouth once daily 90 capsule 0    triamcinolone (KENALOG) 0.1 % cream Apply topically 2 (two) times daily (Patient not taking: Reported on 04/27/2022) 30 g 1     No current facility-administered medications on file prior to visit.       Patient Active Problem List   Diagnosis    Pure hypercholesterolemia    CPK elevation, chronic    Esophageal reflux s/p EGD 5/06    Headache(784.0)    HTN    Lumbago    Anemia, neg w/u in past    Dr. Burnadette Pop Health Maintenance/Addendums/Outside of [facility] & other Studies/Fhx/Shx/Vaccinations/Surgeries    Non morbid obesity    Aortic insufficiency-4/13 echo    Pulmonary embolism 1/18    Peripheral arterial disease    Vitiligo       RESULTRCNT(52w)    This note was completed using Dragon medical speech/voice recognition technology.  Grammatical errors, random word insertions, pronoun errors, incomplete sentences, and other errors are possible due to software limitations.  It is possible that I may have missed such errors when carefully reviewing the note. If the reader has questions or concerns regarding these errors or this note, please address them with me for clarification.     This note was electronically signed by Darlina Sicilian, MD

## 2022-05-25 ENCOUNTER — Ambulatory Visit: Payer: No Typology Code available for payment source | Attending: Family Medicine | Admitting: Family Medicine

## 2022-05-25 ENCOUNTER — Other Ambulatory Visit
Admission: RE | Admit: 2022-05-25 | Discharge: 2022-05-25 | Disposition: A | Discharge status: Home / Self Care | Payer: No Typology Code available for payment source | Source: Ambulatory Visit | Attending: Family Medicine | Admitting: Family Medicine

## 2022-05-25 ENCOUNTER — Encounter (RURAL_HEALTH_CENTER): Payer: Self-pay | Admitting: Family Medicine

## 2022-05-25 VITALS — BP 132/86 | HR 81 | Temp 97.8°F | Resp 19 | Ht 67.0 in | Wt 218.8 lb

## 2022-05-25 DIAGNOSIS — I10 Essential (primary) hypertension: Secondary | ICD-10-CM

## 2022-05-25 DIAGNOSIS — E7800 Pure hypercholesterolemia, unspecified: Secondary | ICD-10-CM

## 2022-05-25 MED ORDER — HYDROCHLOROTHIAZIDE 12.5 MG PO TABS
12.5000 mg | ORAL_TABLET | Freq: Every day | ORAL | 0 refills | Status: DC
Start: ? — End: 2022-05-25

## 2022-05-25 NOTE — Progress Notes (Signed)
Progress Note: 05/25/2022  Core note status:  Last Core Note 04/27/2022, next Core f/u due 10/27/22.       Noah Ali,   Date of birth: [birth year], 71 y.o., male     Todays labs were drawn while nonfasting             Addendums: if there any, they can be found in the Problem Summary List under "Dr. Burnadette Pop Health Maintenance/Addendums/Outside of [facility] & other Studies/Fhx/Shx/Vaccinations/Surgeries"    BP 132/86 (BP Site: Right arm, Patient Position: Sitting, Cuff Size: Medium)   Pulse 81   Temp 97.8 F (36.6 C) (Skin)   Resp 19   Ht 1.702 m (5\' 7" )   Wt 99.2 kg (218 lb 12.8 oz)   SpO2 98%   BMI 34.27 kg/m        Chief Complaint: Pt here for Chronic medical problems followup:    Subjective:   This 71 y.o. male presents for evaluation of the following medical condition(s):      1 months  follow-up from 04/27/2022 visit for hypertension and labs not drawn at last visit.       1) Hypertension on amlodipine 5 mg daily, Cardura 8 mg nightly, and spironolactone 25 mg daily with good compliance with spironolactone 25 mg daily added at his 04/27/2022 visit. Pt is monitoring home blood pressures with most around 140/90.   BP Readings from Last 3 Encounters:   05/25/22 132/86   04/27/22 150/80   10/13/21 140/86            In review: Pt experienced acute kidney injury with peak creatinine at 1.76 during admission 1/18. ACE inhibitor and HCTZ withheld with renal function returning to normal.  Recent creatinines have been excellent with most recent testing November 2022 at 1.16 consistent with GFR 67     2) Hyperlipidemia on Lipitor and due for labs    Review of Systems.   There is a history of PAD    Denies chest pain, shortness of breath, side effects, or past medical history of CAD, CVA     OBJECTIVE:    -Overweight Pt in NAD    -Neck: supple, soft bruit bilaterally with carotid ultrasound showing insignificant PAD  -Respiratory: CTA    -Cardiovascular: RRR with 1-2/6 SEM precordial area w/o gallops     -Abdomen: soft and nontender    -Extremities: Without edema      Blood pressure right arm sitting his wrist cuff 140/77, and same arm within 1 minute by me using large cuff 155/70, and repeat his cuff within 1 minute of mine again 145/82.  Left arm sitting his machine within 1 minute of his right arm 144/82, and again by me within 1 minute 154/68     Assessment/Plan:  1) Hypertension with goal blood pressure less than 140/90    BMP    We will start him on HCTZ 12.5 mg daily in addition to his other medicines.  If not at goal blood pressure in 2 weeks, he is to double this dose  I advised that he add 10 points to his systolic numbers and subtract 5 points from the diastolic  Follow-up 1 month  2) Hyperlipidemia with labs today  Lipid profile     Health maintenance: Flu shot advised for this coming October, Covid 19 vaccine series declined, Hep B Vaccine (19-59) not indicated, Shingrix series advised          ---------------------------------------------------------------------------------------------------------   The Progress  note for  today is completed above in a SOAP note format. The patients allergies, med list, and Problem Summary List are recorded below, as well as a list of helpful smart phrases.  ---------------------------------------------------------------------------------------------------------      For those patients followed by me, please go to the problem summary list and look under 'Overview' "Dr. Burnadette Popimmons Health Maintenance/Addendums/Outside of [facility] & other Studies/Fhx/Shx/Vaccinations/Surgeries" which was reviewed and updated (if indicated) this date    Smart Phrases starting with "JWT":   *ADD           *colic                 *HTNmeds      *NSAIDS                 *PSA  *Asthma       *constipation     *laceration       *peripheraledema   *truancy  *BRCA         *dementia         *naturalmeds   *polycythemia    No Known Allergies    Current Outpatient Medications on File Prior to  Visit   Medication Sig Dispense Refill    amLODIPine (NORVASC) 5 MG tablet Take 1 tablet by mouth once daily 90 tablet 1    atorvastatin (LIPITOR) 40 MG tablet Take 1 tablet by mouth once daily 90 tablet 1    cyanocobalamin 1000 MCG tablet Take 1 tablet (1,000 mcg) by mouth 2 (two) times daily      doxazosin (CARDURA) 8 MG tablet TAKE 1 TABLET BY MOUTH ONCE DAILY AT NIGHT 90 tablet 1    Eliquis 5 MG TAKE 1 TABLET BY MOUTH EVERY 12 HOURS 180 tablet 0    omeprazole (PriLOSEC) 20 MG capsule Take 1 capsule (20 mg) by mouth daily 90 capsule 0    spironolactone (Aldactone) 25 MG tablet Take 1 tablet (25 mg) by mouth daily 90 tablet 0    triamcinolone (KENALOG) 0.1 % cream Apply topically 2 (two) times daily 30 g 1     No current facility-administered medications on file prior to visit.       Patient Active Problem List   Diagnosis    Pure hypercholesterolemia    CPK elevation, chronic    Esophageal reflux s/p EGD 5/06    Headache(784.0)    HTN    Lumbago    Anemia, neg w/u in past    Dr. Burnadette Popimmons Health Maintenance/Addendums/[facility] & other Studies/Fhx/Shx/Vaccinations/Surgeries    Non morbid obesity    Aortic insufficiency-4/13 echo    Pulmonary embolism 1/18    Peripheral arterial disease    Vitiligo       RESULTRCNT(52w)    This note was completed using Dragon medical speech/voice recognition technology.  Grammatical errors, random word insertions, pronoun errors, incomplete sentences, and other errors are possible due to software limitations.  It is possible that I may have missed such errors when carefully reviewing the note. If the reader has questions or concerns regarding these errors or this note, please address them with me for clarification.     This note was electronically signed by Darlina SicilianJohn W Shawntrice Salle, MD

## 2022-05-25 NOTE — Progress Notes (Signed)
Date Specimen Drawn:  05/25/2022   Time Specimen Drawn:  11:54 AM   Test(s) Ordered:  Lipid panel & BMP    Disposition:  n/a   Patient's Tolerance:  Good   Location Specimen Drawn:  Left antecubital

## 2022-05-26 LAB — BASIC METABOLIC PANEL
Anion Gap: 14.7 mMol/L (ref 7.0–18.0)
BUN / Creatinine Ratio: 16.4 Ratio (ref 10.0–30.0)
BUN: 20 mg/dL (ref 7–22)
CO2: 17 mMol/L — ABNORMAL LOW (ref 20–30)
Calcium: 9 mg/dL (ref 8.5–10.5)
Chloride: 111 mMol/L — ABNORMAL HIGH (ref 98–110)
Creatinine: 1.22 mg/dL (ref 0.80–1.30)
EGFR: 63 mL/min/{1.73_m2} (ref 60–150)
Glucose: 85 mg/dL (ref 71–99)
Osmolality Calculated: 278 mOsm/kg (ref 275–300)
Potassium: 4.7 mMol/L (ref 3.5–5.3)
Sodium: 138 mMol/L (ref 136–147)

## 2022-05-26 LAB — LIPD PANEL WITH NON-HDL CHOLESTEROL, SERUM
Cholesterol: 124 mg/dL (ref 75–199)
Coronary Heart Disease Risk: 2.43
HDL: 51 mg/dL (ref 40–55)
LDL Calculated: 64 mg/dL
NON HDL CHOLESTEROL: 73 mg/dL
Triglycerides: 47 mg/dL (ref 10–150)
VLDL: 9 (ref 0–40)

## 2022-06-29 ENCOUNTER — Ambulatory Visit: Payer: No Typology Code available for payment source | Attending: Family Medicine | Admitting: Family Medicine

## 2022-06-29 ENCOUNTER — Encounter (RURAL_HEALTH_CENTER): Payer: Self-pay | Admitting: Family Medicine

## 2022-06-29 VITALS — BP 146/90 | HR 74 | Temp 97.8°F | Resp 19 | Ht 67.0 in | Wt 212.8 lb

## 2022-06-29 DIAGNOSIS — I10 Essential (primary) hypertension: Secondary | ICD-10-CM

## 2022-06-29 MED ORDER — OMEPRAZOLE 20 MG PO CPDR
20.0000 mg | DELAYED_RELEASE_CAPSULE | Freq: Every day | ORAL | 0 refills | Status: DC
Start: ? — End: 2022-06-29

## 2022-06-29 MED ORDER — SPIRONOLACTONE 25 MG PO TABS
25.0000 mg | ORAL_TABLET | Freq: Every day | ORAL | 0 refills | Status: DC
Start: ? — End: 2022-06-29

## 2022-06-29 MED ORDER — AMLODIPINE BESYLATE 5 MG PO TABS
5.0000 mg | ORAL_TABLET | Freq: Every day | ORAL | 1 refills | Status: DC
Start: ? — End: 2022-06-29

## 2022-06-29 MED ORDER — APIXABAN 5 MG PO TABS
5.0000 mg | ORAL_TABLET | Freq: Two times a day (BID) | ORAL | 1 refills | Status: DC
Start: ? — End: 2022-06-29

## 2022-06-29 MED ORDER — DOXAZOSIN MESYLATE 8 MG PO TABS
8.0000 mg | ORAL_TABLET | Freq: Every evening | ORAL | 1 refills | Status: DC
Start: ? — End: 2022-06-29

## 2022-06-29 MED ORDER — TRIAMCINOLONE ACETONIDE 0.1 % EX CREA
TOPICAL_CREAM | Freq: Two times a day (BID) | CUTANEOUS | 1 refills | Status: DC
Start: ? — End: 2022-06-29

## 2022-06-29 MED ORDER — ATORVASTATIN CALCIUM 40 MG PO TABS
40.0000 mg | ORAL_TABLET | Freq: Every day | ORAL | 1 refills | Status: DC
Start: ? — End: 2022-06-29

## 2022-06-29 MED ORDER — HYDROCHLOROTHIAZIDE 25 MG PO TABS
25.0000 mg | ORAL_TABLET | Freq: Every day | ORAL | 1 refills | Status: DC
Start: ? — End: 2022-06-29

## 2022-06-29 NOTE — Progress Notes (Signed)
Progress Note: 06/29/2022   1 months  follow-up from 05/25/2022 visit for uncontrolled hypertension.  Core note status:  Last Core Note 04/27/2022, next Core f/u due 10/27/22.  Follow-up 1 month for uncontrolled hypertension      Noah Ali,   Date of birth: [birth year], 71 y.o., male                 Addendums: if there any, they can be found in the Problem Summary List under "Dr. Burnadette Pop Health Maintenance/Addendums/Outside of [facility] & other Studies/Fhx/Shx/Vaccinations/Surgeries"    BP 146/90 (BP Site: Left arm, Patient Position: Sitting, Cuff Size: Medium)   Pulse 74   Temp 97.8 F (36.6 C) (Skin)   Resp 19   Ht 1.702 m (5\' 7" )   Wt 96.5 kg (212 lb 12.8 oz)   SpO2 97%   BMI 33.33 kg/m        Chief Complaint: Pt here for Chronic medical problems followup:    Subjective:   This 71 y.o. male presents for evaluation of the following medical condition(s):    4 minutes chart review spent in preparation for this note    1) Hypertension on amlodipine 5 mg daily, Cardura 8 mg nightly, and spironolactone 25 mg daily with good compliance with spironolactone 25 mg daily added at his 04/27/2022 visit.  At patient's 05/25/2022 visit we started him additionally on HCTZ 12.5 mg to be doubled in 2 weeks if most blood pressures were not less than 140/90 at that time--he remains on 1 pill of that.  Pt is monitoring home blood pressures with most around 140/90.          In review: Pt experienced acute kidney injury with peak creatinine at 1.76 during admission 1/18. ACE inhibitor and HCTZ withheld with renal function returning to normal.  Recent creatinines have been excellent with most recent testing July 2023 with creatinine 1.22 consistent with GFR of 63       Review of Systems.   There is a history of PAD    Denies chest pain, shortness of breath, side effects, or past medical history of CAD, CVA     OBJECTIVE:    -Overweight Pt in NAD    -Neck: supple, soft bruit bilaterally with carotid ultrasound showing  insignificant PAD  -Respiratory: CTA    -Cardiovascular: RRR with 1-2/6 SEM precordial area w/o gallops    -Extremities: Without edema     Repeat blood pressure by me right arm sitting which is his higher reading arm was 148/70     Assessment/Plan:  1) Hypertension still suboptimal control.  I would like him to increase his HCTZ to 25 mg daily and if most blood pressures are not less than 140/90 in 2 weeks to give [place] a call  Serial blood pressures and follow-up 1 month  Bring blood pressure cuff with you at the next visit since he has a new blood pressure cuff now     Health maintenance: Flu shot advised for this coming October, Covid 19 vaccine series declined, Hep B Vaccine (19-59) not indicated, Shingrix series advised      ---------------------------------------------------------------------------------------------------------   The Progress  note for today is completed above in a SOAP note format. The patients allergies, med list, and Problem Summary List are recorded below, as well as a list of helpful smart phrases.  ---------------------------------------------------------------------------------------------------------      For those patients followed by me, please go to the problem summary list and look under 'Overview' "Dr.  Alletta Mattos Health Maintenance/Addendums/Outside of [facility] & other Studies/Fhx/Shx/Vaccinations/Surgeries" which was reviewed and updated (if indicated) this date    Smart Phrases starting with "JWT":   *ADD           *colic                 *HTNmeds      *NSAIDS                 *PSA  *Asthma       *constipation     *laceration       *peripheraledema   *truancy  *BRCA         *dementia         *naturalmeds   *polycythemia    No Known Allergies    Current Outpatient Medications on File Prior to Visit   Medication Sig Dispense Refill    amLODIPine (NORVASC) 5 MG tablet Take 1 tablet by mouth once daily 90 tablet 1    atorvastatin (LIPITOR) 40 MG tablet Take 1 tablet by mouth once  daily 90 tablet 1    cyanocobalamin 1000 MCG tablet Take 1 tablet (1,000 mcg) by mouth 2 (two) times daily      doxazosin (CARDURA) 8 MG tablet TAKE 1 TABLET BY MOUTH ONCE DAILY AT NIGHT 90 tablet 1    Eliquis 5 MG TAKE 1 TABLET BY MOUTH EVERY 12 HOURS 180 tablet 0    hydroCHLOROthiazide (HYDRODIURIL) 12.5 MG tablet Take 1 tablet (12.5 mg) by mouth daily 90 tablet 0    omeprazole (PriLOSEC) 20 MG capsule Take 1 capsule (20 mg) by mouth daily 90 capsule 0    spironolactone (Aldactone) 25 MG tablet Take 1 tablet (25 mg) by mouth daily 90 tablet 0    triamcinolone (KENALOG) 0.1 % cream Apply topically 2 (two) times daily 30 g 1     No current facility-administered medications on file prior to visit.       Patient Active Problem List   Diagnosis    Pure hypercholesterolemia    CPK elevation, chronic    Esophageal reflux s/p EGD 5/06    Headache(784.0)    HTN    Lumbago    Anemia, neg w/u in past    Dr. Burnadette Pop Health Maintenance/Addendums/Outside of [facility] & other Studies/Fhx/Shx/Vaccinations/Surgeries    Non morbid obesity    Aortic insufficiency-4/13 echo    Pulmonary embolism 1/18    Peripheral arterial disease    Vitiligo       RESULTRCNT(52w)    This note was completed using Dragon medical speech/voice recognition technology.  Grammatical errors, random word insertions, pronoun errors, incomplete sentences, and other errors are possible due to software limitations.  It is possible that I may have missed such errors when carefully reviewing the note. If the reader has questions or concerns regarding these errors or this note, please address them with me for clarification.     This note was electronically signed by Darlina Sicilian, MD

## 2022-08-03 ENCOUNTER — Ambulatory Visit (RURAL_HEALTH_CENTER): Payer: Self-pay | Admitting: Family Medicine

## 2022-08-17 ENCOUNTER — Ambulatory Visit: Payer: No Typology Code available for payment source | Attending: Family Medicine | Admitting: Family Medicine

## 2022-08-17 ENCOUNTER — Other Ambulatory Visit
Admission: RE | Admit: 2022-08-17 | Discharge: 2022-08-17 | Disposition: A | Discharge status: Home / Self Care | Payer: No Typology Code available for payment source | Source: Ambulatory Visit | Attending: Family Medicine | Admitting: Family Medicine

## 2022-08-17 ENCOUNTER — Encounter (RURAL_HEALTH_CENTER): Payer: Self-pay | Admitting: Family Medicine

## 2022-08-17 VITALS — BP 130/74 | HR 88 | Temp 97.1°F | Resp 16 | Ht 67.0 in | Wt 213.8 lb

## 2022-08-17 DIAGNOSIS — Z87891 Personal history of nicotine dependence: Secondary | ICD-10-CM

## 2022-08-17 DIAGNOSIS — Z0000 Encounter for general adult medical examination without abnormal findings: Secondary | ICD-10-CM

## 2022-08-17 DIAGNOSIS — I10 Essential (primary) hypertension: Secondary | ICD-10-CM

## 2022-08-17 NOTE — Progress Notes (Signed)
Progress Note: 08/17/2022   1 months  follow-up from 06/29/2022 visit for uncontrolled hypertension.  Core note status:  Last Core Note 04/27/2022, next Core f/u due 10/27/22.        Noah Ali,   Date of birth: 09/28/1951, 71 y.o., male     Todays labs were drawn while nonfasting             Addendums: if there any, they can be found in the Problem Summary List under "Dr. Burnadette Popimmons Health Maintenance/Addendums/[facility] & other Studies/Fhx/Shx/Vaccinations/Surgeries"    BP 130/74 (BP Site: Right arm, Patient Position: Sitting, Cuff Size: Medium)   Pulse 88   Temp 97.1 F (36.2 C)   Resp 16   Ht 1.702 m (5\' 7" )   Wt 97 kg (213 lb 12.8 oz)   SpO2 97%   BMI 33.49 kg/m          Chief Complaint: Pt here for Chronic medical problems followup:    Subjective:   This 71 y.o. male presents for evaluation of the following medical condition(s):    7 minutes chart review spent in preparation for this note     1) Hypertension on amlodipine 5 mg daily, Cardura 8 mg nightly,  spironolactone 25 mg daily, and now HCTZ 25 mg daily up from 12.5 mg daily with instructions to call us if most blood pressures were not less than 140/90 in 2 weeks.  Pt is monitoring home blood pressures with most less than 140/90.  He forgot to bring his blood pressure cuff for an accuracy check today, but we will do it next time around.     In review: Pt experienced acute kidney injury with peak creatinine at 1.76 during admission 1/18. ACE inhibitor and HCTZ withheld with renal function returning to normal.  Recent creatinines have been excellent with most recent testing July 2023 with creatinine 1.22 consistent with GFR of 63     Review of Systems.   There is a history of PAD    Denies chest pain, shortness of breath, side effects, or past medical history of CAD, CVA     OBJECTIVE:    -Overweight Pt in NAD    -Neck: supple, soft bruit bilaterally with carotid ultrasound showing insignificant PAD  -Respiratory: CTA     -Cardiovascular: RRR with 1-2/6 SEM precordial area w/o gallops    -Extremities: Without edema      Assessment/Plan:  1) Hypertension under excellent control  Basic metabolic profile  Follow-up 4 months    Health maintenance: Flu shot declined today, Covid 19 vaccine series declined, Hep B Vaccine (19-59) not indicated, Shingrix series advised         [id number]: 20-29 minutes spent with patient        ---------------------------------------------------------------------------------------------------------   The Progress  note for today is completed above in a SOAP note format. The patients allergies, med list, and Problem Summary List are recorded below, as well as a list of helpful smart phrases.  ---------------------------------------------------------------------------------------------------------      For those patients followed by me, please go to the problem summary list and look under 'Overview' "Dr. Burnadette Popimmons Health Maintenance/Addendums/[facility] & other Studies/Fhx/Shx/Vaccinations/Surgeries" which was reviewed and updated (if indicated) this date    Smart Phrases starting with "JWT":   *ADD           *colic                 *HTNmeds      *  NSAIDS                 *PSA  *Asthma       *constipation     *laceration       *peripheraledema   *truancy  *BRCA         *dementia         *naturalmeds   *polycythemia    No Known Allergies    Current Outpatient Medications on File Prior to Visit   Medication Sig Dispense Refill    amLODIPine (NORVASC) 5 MG tablet Take 1 tablet (5 mg) by mouth daily 90 tablet 1    apixaban (Eliquis) 5 MG Take 1 tablet (5 mg) by mouth every 12 (twelve) hours 180 tablet 1    atorvastatin (LIPITOR) 40 MG tablet Take 1 tablet (40 mg) by mouth daily 90 tablet 1    cyanocobalamin 1000 MCG tablet Take 1 tablet (1,000 mcg) by mouth 2 (two) times daily      doxazosin (CARDURA) 8 MG tablet Take 1 tablet (8 mg) by mouth nightly 90 tablet 1    hydroCHLOROthiazide (HYDRODIURIL) 25  MG tablet Take 1 tablet (25 mg) by mouth daily 90 tablet 1    omeprazole (PriLOSEC) 20 MG capsule Take 1 capsule (20 mg) by mouth daily 90 capsule 0    spironolactone (Aldactone) 25 MG tablet Take 1 tablet (25 mg) by mouth daily 90 tablet 0    triamcinolone (KENALOG) 0.1 % cream Apply topically 2 (two) times daily 30 g 1     No current facility-administered medications on file prior to visit.       Patient Active Problem List   Diagnosis    Pure hypercholesterolemia    CPK elevation, chronic    Esophageal reflux s/p EGD 5/06    Headache(784.0)    HTN    Lumbago    Anemia, neg w/u in past    Dr. Burnadette Pop Health Maintenance/Addendums/Outside of [facility] & other Studies/Fhx/Shx/Vaccinations/Surgeries    Non morbid obesity    Aortic insufficiency-4/13 echo    Pulmonary embolism 1/18    Peripheral arterial disease    Vitiligo       RESULTRCNT(52w)    This note was completed using Dragon medical speech/voice recognition technology.  Grammatical errors, random word insertions, pronoun errors, incomplete sentences, and other errors are possible due to software limitations.  It is possible that I may have missed such errors when carefully reviewing the note. If the reader has questions or concerns regarding these errors or this note, please address them with me for clarification.     This note was electronically signed by Darlina Sicilian, MD

## 2022-08-17 NOTE — Progress Notes (Signed)
Date Specimen Drawn:  08/17/2022   Time Specimen Drawn:  10:36 AM   Test(s) Ordered:  BMP   Disposition:  Very pleasant gentleman.  First stick unsuccessful in rt antecubital, second attempt successful in left antecubital.  Pt tolerated well. Complaint free.   Patient's Tolerance:  Good   Location Specimen Drawn:  Left antecubital

## 2022-08-18 LAB — BASIC METABOLIC PANEL
Anion Gap: 17 mMol/L (ref 7.0–18.0)
BUN / Creatinine Ratio: 14.7 Ratio (ref 10.0–30.0)
BUN: 23 mg/dL — ABNORMAL HIGH (ref 7–22)
CO2: 21 mMol/L (ref 20–30)
Calcium: 9.2 mg/dL (ref 8.5–10.5)
Chloride: 104 mMol/L (ref 98–110)
Creatinine: 1.56 mg/dL — ABNORMAL HIGH (ref 0.80–1.30)
EGFR: 47 mL/min/{1.73_m2} — ABNORMAL LOW (ref 60–150)
Glucose: 102 mg/dL — ABNORMAL HIGH (ref 71–99)
Osmolality Calculated: 280 mOsm/kg (ref 275–300)
Potassium: 4 mMol/L (ref 3.5–5.3)
Sodium: 138 mMol/L (ref 136–147)

## 2022-09-14 ENCOUNTER — Other Ambulatory Visit (RURAL_HEALTH_CENTER): Payer: Self-pay | Admitting: Family Medicine

## 2022-12-21 ENCOUNTER — Ambulatory Visit (RURAL_HEALTH_CENTER): Payer: Self-pay | Admitting: Family Medicine

## 2023-05-01 IMAGING — MR MRI PROSTATE WO/W CONTRAST
10 series · 48 of 48 positions shown · non-contrast
Comparison: None.

REASON FOR EXAM: Elevated PSA; prior history of rectal cancer.
TECHNIQUE: Multiplanar, multisequence 3T MR images of the pelvis were acquired.  Axial diffusion weighted EPI images at b values of 0, 50, and 800 s/mm2 were acquired to generate an ADC map.  Data from the ADC map produced an additional set of DWI images, at a calculated b value of 5666 s/mm2.  Dynamic contrast enhanced sequences were then acquired using a rapid TWIST sequence an injection rate of 1.5 cc/s.

[Series 2: haste_3 plane loc · axial · 6.0mm · 1.17mm/px · 1 of 15 slices shown]
[im 1/15]
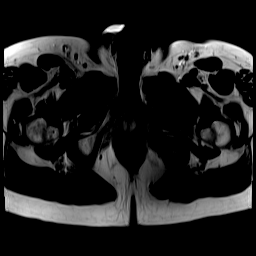

[Series 3: t1_axial_tse · axial · 6.0mm · 0.99mm/px · 1 of 28 slices shown]
[im 1/28]
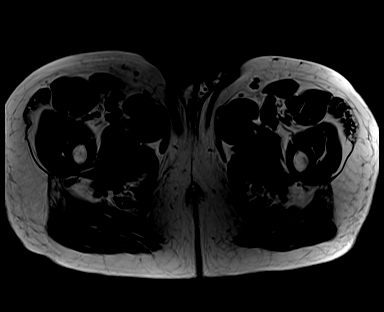

[Series 4: t2_tse_blade_sag · sagittal · 3.0mm · 0.66mm/px · 1 of 28 slices shown]
[im 1/28]
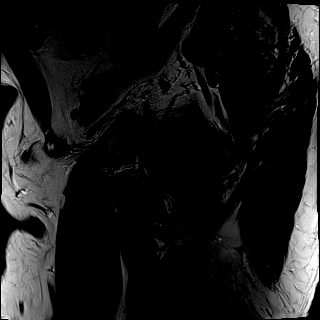

[Series 5: t2_tse_tra · axial · 3.0mm · 0.62mm/px · 1 of 24 slices shown]
[im 1/24]
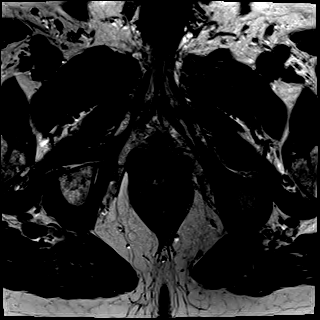

[Series 6: t2_tse_cor · coronal · 3.0mm · 0.62mm/px · 1 of 20 slices shown]
[im 1/20]
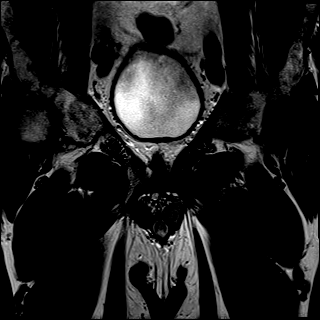

[Series 7: t2_tse_blade_tra · axial · 3.0mm · 0.66mm/px · 1 of 24 slices shown]
[im 1/24]
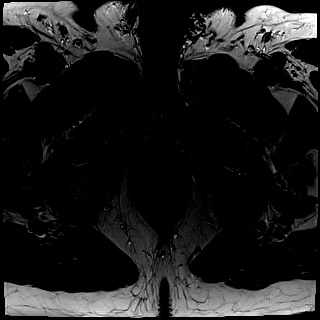

[Series 8: resolve_diff_b50_800_tra_p2_tracew · axial · 3.5mm · 1.52mm/px · 1 of 20 slices shown]
[im 1/20]
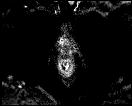

[Series 9: resolve_diff_b50_800_tra_p2_adc · axial · 3.5mm · 1.52mm/px · 1 of 20 slices shown]
[im 1/20]
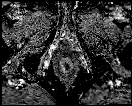

[Series 10: resolve_diff_b50_800_tra_p2_calc_bval · axial · 3.5mm · 1.52mm/px · 1 of 20 slices shown]
[im 1/20]
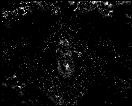

[Series 11: t1_twist_tra_dyn · axial · 3.5mm · 1.04mm/px · z∈[-11,+55]mm · 39 of 960 slices shown]
[im 1/960]
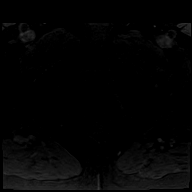
[im 26/960]
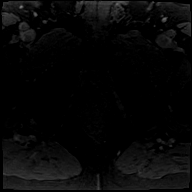
[im 51/960]
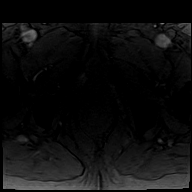
[im 76/960]
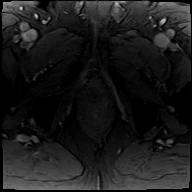
[im 101/960]
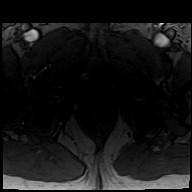
[im 127/960]
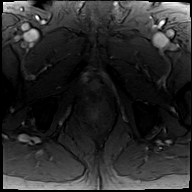
[im 152/960]
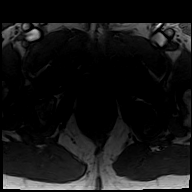
[im 177/960]
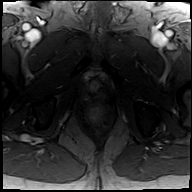
[im 202/960]
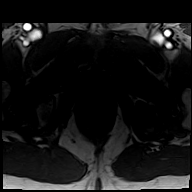
[im 228/960]
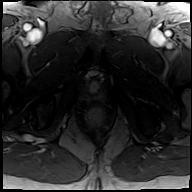
[im 253/960]
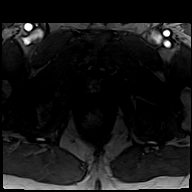
[im 278/960]
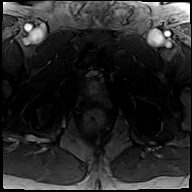
[im 303/960]
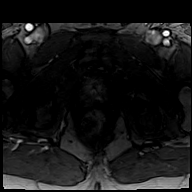
[im 329/960]
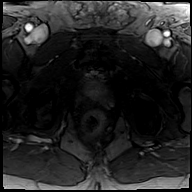
[im 354/960]
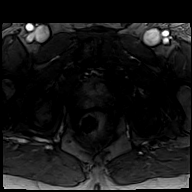
[im 379/960]
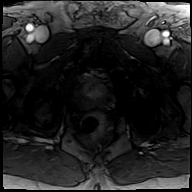
[im 404/960]
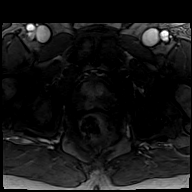
[im 430/960]
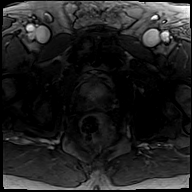
[im 455/960]
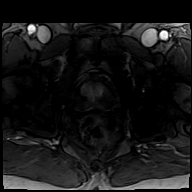
[im 480/960]
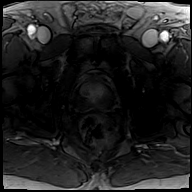
[im 505/960]
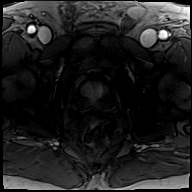
[im 530/960]
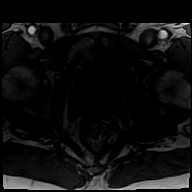
[im 556/960]
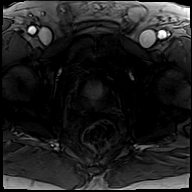
[im 581/960]
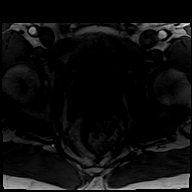
[im 606/960]
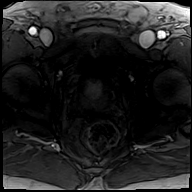
[im 631/960]
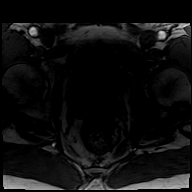
[im 657/960]
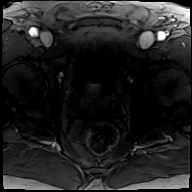
[im 682/960]
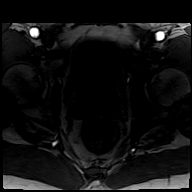
[im 707/960]
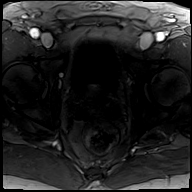
[im 732/960]
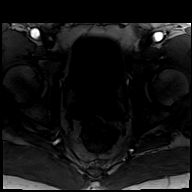
[im 758/960]
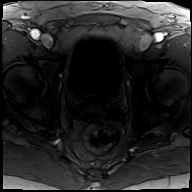
[im 783/960]
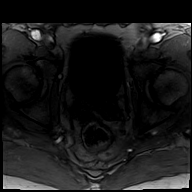
[im 808/960]
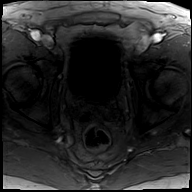
[im 833/960]
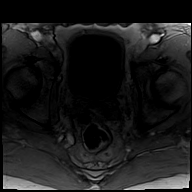
[im 859/960]
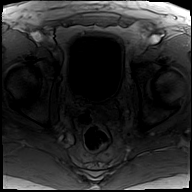
[im 884/960]
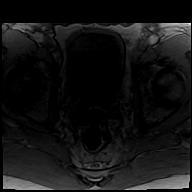
[im 909/960]
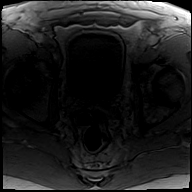
[im 934/960]
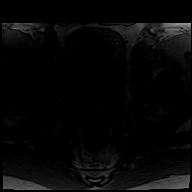
[im 960/960]
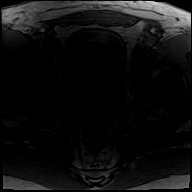

[48 of 48 positions shown; findings below may reference images not displayed]

IV Contrast: 20 cc ProHance

Serum PSA: 5.0 ng/mL

PI-RADS defines clinically significant prostate cancer as Gleason score >=7 (including 3+4), and/or volume >0.5 cc, and/or extraprostatic extension.

PI-RADS Assessment Categories

PI-RADS 1 - Very low (clinically significant cancer is highly unlikely to be present)

PI-RADS 2 - Low (clinically significant cancer is unlikely to be present)

PI-RADS 3 - Intermediate (the presence of clinically significant cancer is equivocal)

PI-RADS 4 - High (clinically significant cancer is likely to be present)

PI-RADS 5 - Very high (clinically significant cancer is highly likely to be present)
FINDINGS: PROSTATE DIMENSIONS: 49 x 33 x 48 mm, which equates to a volume of 41 mL.  

PSA density 0.122 ng/mL/mL (normal less than 0.15 ng/mL).

IMAGE QUALITY: Good

POST-INTERVENTIONAL SIGNAL: No T1-gland abnormality is present.

PERIPHERAL ZONE: On T2 imaging, there is a subtle T2-dark nodule in the right posteromedial peripheral zone ([DATE]), at the level of the mid gland, measuring 9 x 6 x 7 mm in diameter showing profound diffusion restriction with ADC map B value of 622 and early hyperperfusion ([DATE]) consistent with small prostate carcinoma confined to the gland capsule. No other peripheral zone finding is seen.

TRANSITIONAL ZONE: There are a few small benign appearing BPH nodules on T2-weighted imaging with no diffusion or perfusion findings of high-grade prostate malignancy.

NEUROVASCULAR BUNDLES: Normal

SEMINAL VESICLES: Normal

LYMPH NODES: No enlarged or morphologically suspicious lymph nodes.

BONES: No suspicious marrow signal abnormality or concerning enhancement.

PELVIC VISCERA: Left colon is normal. Bladder is normal. No inguinal findings are present.
IMPRESSION: Small high-grade prostate malignancy confined to the gland capsule is suspected in the right posteromedial peripheral zone at mid gland level, as discussed above.  (PI-RADS 4). Consultation with urology is advised.

Report called to referring physician, 04/12/2021, at[DATE].
